# Patient Record
Sex: Male | Born: 1955 | Race: White | Hispanic: No | Marital: Married | Smoking: Never smoker
Health system: Southern US, Community
[De-identification: ages and names within clinical notes are randomized; demographics above are authoritative.]

## PROBLEM LIST (undated history)

## (undated) DIAGNOSIS — I10 Essential (primary) hypertension: Secondary | ICD-10-CM

## (undated) DIAGNOSIS — E785 Hyperlipidemia, unspecified: Secondary | ICD-10-CM

## (undated) HISTORY — DX: Hyperlipidemia, unspecified: E78.5

## (undated) HISTORY — DX: Essential (primary) hypertension: I10

## (undated) HISTORY — PX: CARDIAC CATHETERIZATION: SHX172

---

## 2002-04-25 ENCOUNTER — Emergency Department (HOSPITAL_COMMUNITY): Admission: EM | Admit: 2002-04-25 | Discharge: 2002-04-26 | Payer: Self-pay | Admitting: Emergency Medicine

## 2002-11-21 ENCOUNTER — Inpatient Hospital Stay (HOSPITAL_COMMUNITY): Admission: EM | Admit: 2002-11-21 | Discharge: 2002-11-23 | Payer: Self-pay | Admitting: Emergency Medicine

## 2010-05-23 ENCOUNTER — Other Ambulatory Visit: Payer: Self-pay | Admitting: Cardiovascular Disease

## 2010-05-23 MED ORDER — VALSARTAN-HYDROCHLOROTHIAZIDE 80-12.5 MG PO TABS: 1.0000 | ORAL_TABLET | Freq: Every day | ORAL | Status: DC

## 2010-05-23 NOTE — Telephone Encounter (Signed)
Want his Diavan HCT called in for 30 d supply at [facility] [address]  Please call patient for further detail

## 2010-05-23 NOTE — Telephone Encounter (Signed)
Pt not been seen in over a year, needs ov and msg left.Alfonso Ramus RN

## 2010-06-30 ENCOUNTER — Telehealth: Payer: Self-pay | Admitting: Cardiovascular Disease

## 2010-06-30 DIAGNOSIS — E785 Hyperlipidemia, unspecified: Secondary | ICD-10-CM

## 2010-06-30 NOTE — Telephone Encounter (Signed)
PT MADE A 53YR OV WITH DR NAHSER AND WANTS TO KNOW IF HE NEEDS TO HAVE ANY LABS. CHART IN BOX.

## 2010-06-30 NOTE — Telephone Encounter (Signed)
Needs labs, told to fast, orders put into system.

## 2010-07-03 ENCOUNTER — Telehealth: Payer: Self-pay | Admitting: Cardiovascular Disease

## 2010-07-03 NOTE — Telephone Encounter (Signed)
Chest discomfort with a lot of belching, Mylanta helped. He Spoke w on call Helen doc over weekend, sounded gastral in nature per pt/dr. Pain was bilateral upper chest, w/belching, had some relief with prilosec and mylanta, +sweating a bit, no sob, no nausea, didn't get vitals, pt did exercise 40 min on elliptical over weekend without discomfort/ pt at work. C/o still not feeling real well, gave Friday app and told to go to er if after hours but to call back if wants to be seen sooner, pt verbalized understanding and will call. Alfonso Ramus RN

## 2010-07-03 NOTE — Telephone Encounter (Signed)
Please call patient, he had some chest discomfort.  Did speak with the physician on call over the weekend.  Made appt for 07/07/2010.  Please call patient to discuss the need.

## 2010-07-04 ENCOUNTER — Encounter: Payer: Self-pay | Admitting: *Deleted

## 2010-07-04 ENCOUNTER — Ambulatory Visit (INDEPENDENT_AMBULATORY_CARE_PROVIDER_SITE_OTHER): Payer: 59 | Admitting: Cardiovascular Disease

## 2010-07-04 ENCOUNTER — Encounter: Payer: Self-pay | Admitting: Cardiovascular Disease

## 2010-07-04 DIAGNOSIS — I10 Essential (primary) hypertension: Secondary | ICD-10-CM | POA: Insufficient documentation

## 2010-07-04 DIAGNOSIS — E785 Hyperlipidemia, unspecified: Secondary | ICD-10-CM

## 2010-07-04 DIAGNOSIS — R079 Chest pain, unspecified: Secondary | ICD-10-CM | POA: Insufficient documentation

## 2010-07-04 LAB — BASIC METABOLIC PANEL
BUN: 14 mg/dL (ref 6–23)
CO2: 32 mEq/L (ref 19–32)
Calcium: 9.5 mg/dL (ref 8.4–10.5)
Chloride: 103 mEq/L (ref 96–112)
Creatinine, Ser: 0.9 mg/dL (ref 0.4–1.5)
GFR: 88.76 mL/min (ref >60.00)
Glucose, Bld: 99 mg/dL (ref 70–99)
Potassium: 4.3 mEq/L (ref 3.5–5.1)
Sodium: 140 mEq/L (ref 135–145)

## 2010-07-04 LAB — HEPATIC FUNCTION PANEL
ALT: 21 U/L (ref 0–53)
AST: 24 U/L (ref 0–37)
Albumin: 4.5 g/dL (ref 3.5–5.2)
Alkaline Phosphatase: 66 U/L (ref 39–117)
Bilirubin, Direct: 0 mg/dL (ref 0.0–0.3)
Total Bilirubin: 0.6 mg/dL (ref 0.3–1.2)
Total Protein: 7.4 g/dL (ref 6.0–8.3)

## 2010-07-04 LAB — LIPID PANEL
Cholesterol: 187 mg/dL (ref 0–200)
HDL: 43.4 mg/dL (ref >39.00)
LDL Cholesterol: 126 mg/dL — ABNORMAL HIGH (ref 0–99)
Total CHOL/HDL Ratio: 4
Triglycerides: 89 mg/dL (ref 0.0–149.0)
VLDL: 17.8 mg/dL (ref 0.0–40.0)

## 2010-07-04 NOTE — Progress Notes (Signed)
Bradley Dean Date of Birth  27-Mar-1955 [facility] / [facility] [address]     [address] [place], [place]  [address] [phone]  Fax  [phone]  History of Present Illness:  8-10 days of chest pain.    Still cycling and doing elliptical regularly - without problems.  Had some chest wall pain after the elliptical. This week he had some increased chest pain and lots of belching.  Relief after belching  Chest pain was mid sternal, between shoulder blades, non radiating.  Better after Mylanta.  Has been working out regularly - but yesterday he had some very mild chest pain.  1/10.  Pain lingered 30 minutes after completing his workout.  Current Outpatient Prescriptions  Medication Sig Dispense Refill  . aspirin 81 MG tablet Take 81 mg by mouth daily.        . fexofenadine (ALLEGRA) 180 MG tablet Take 180 mg by mouth daily.        Marland Kitchen FIBER PO Take 2 capsules by mouth daily.       . niacin (NIASPAN) 500 MG CR tablet Take 1,000 mg by mouth at bedtime.        . valsartan-hydrochlorothiazide (DIOVAN-HCT) 80-12.5 MG per tablet Take 0.5 tablets by mouth daily.        Marland Kitchen DISCONTD: valsartan-hydrochlorothiazide (DIOVAN HCT) 80-12.5 MG per tablet Take 1 tablet by mouth daily.  30 tablet  1  . DISCONTD: loratadine (CLARITIN) 10 MG tablet Take 10 mg by mouth daily.            Allergies  Allergen Reactions  . Sulfa Drugs Cross Reactors     Past Medical History  Diagnosis Date  . Hypertension   . Hyperlipidemia   . Chest pain     Past Surgical History  Procedure Date  . Cardiac catheterization     EF 65-&70% -- Normal left ventricular systolic function -- Smooth and normal coronary arteries. -- Vesta Mixer, M.D.    History  Smoking status  . Never Smoker   Smokeless tobacco  . Never Used    History  Alcohol Use No    Family History  Problem Relation Age of Onset  . Heart disease Father     Reviw of Systems:  Reviewed in the HPI.  All  other systems are negative.  Physical Exam: BP 144/94  Pulse 60  Ht 6\' 2"  (1.88 m)  Wt 233 lb (105.688 kg)  BMI 29.92 kg/m2 The patient is alert and oriented x 3.  The mood and affect are normal.   Skin: warm and dry.  Color is normal.    HEENT:   the sclera are nonicteric.  The mucous membranes are moist.  The carotids are 2+ without bruits.  There is no thyromegaly.  There is no JVD.    Lungs: clear.  The chest wall is non tender.    Heart: regular rate with a normal S1 and S2.  There are no murmurs, gallops, or rubs. The PMI is not displaced.     Abdomin: good bowel sounds.  There is no guarding or rebound.  There is no hepatosplenomegaly or tenderness.  There are no masses.   Extremities:  no clubbing, cyanosis, or edema.  The legs are without rashes.  The distal pulses are intact.   Neuro:  Cranial nerves II - XII are intact.  Motor and sensory functions are intact.    The gait is normal.  ECG: NSR. No ST  or T wave changes.  Assessment / Plan:

## 2010-07-04 NOTE — Assessment & Plan Note (Signed)
His blood pressure is well controlled. Continue his current medications.

## 2010-07-04 NOTE — Assessment & Plan Note (Signed)
Bradley Dean presents with some episodes of chest pain. It is difficult to discern whether this is angina. I like to schedule him for a stress echocardiogram for further evaluation. He had a stress echocardiogram several years ago which was normal.

## 2010-07-06 ENCOUNTER — Encounter: Payer: Self-pay | Admitting: Cardiovascular Disease

## 2010-07-06 NOTE — Progress Notes (Signed)
Pt informed of lab results. 

## 2010-07-07 ENCOUNTER — Ambulatory Visit: Payer: Self-pay | Admitting: Cardiovascular Disease

## 2010-07-13 ENCOUNTER — Other Ambulatory Visit (HOSPITAL_COMMUNITY): Payer: 59 | Admitting: Radiology

## 2010-07-14 ENCOUNTER — Telehealth: Payer: Self-pay | Admitting: Cardiovascular Disease

## 2010-07-14 NOTE — Telephone Encounter (Signed)
Pt had episode last night of "fleeting cp" with abd pain, bloat, sweaty, loose stool with relief after BM, feel ok today, no cp. Has stress echo on Monday, advised if he needs to cancel test he needs to go to er. Pt stated he has been following a bland diet to control gastric discomfort and it has helped and has lost 4-5 lb. Pt wants to complete stress echo to r/o heart problem and was just asking since weekend is here. Pt verbalized understanding to go to er if more gastric/cp symptoms over weekend.Alfonso Ramus RN

## 2010-07-14 NOTE — Telephone Encounter (Signed)
Pt has some questions about stress test he's scheduled for. Please call after 12 if possible

## 2010-07-17 ENCOUNTER — Ambulatory Visit (HOSPITAL_BASED_OUTPATIENT_CLINIC_OR_DEPARTMENT_OTHER): Payer: 59 | Admitting: Radiology

## 2010-07-17 ENCOUNTER — Ambulatory Visit (HOSPITAL_COMMUNITY): Payer: 59 | Attending: Cardiovascular Disease | Admitting: Radiology

## 2010-07-17 DIAGNOSIS — R072 Precordial pain: Secondary | ICD-10-CM

## 2010-07-17 DIAGNOSIS — E119 Type 2 diabetes mellitus without complications: Secondary | ICD-10-CM | POA: Insufficient documentation

## 2010-07-17 DIAGNOSIS — R0989 Other specified symptoms and signs involving the circulatory and respiratory systems: Secondary | ICD-10-CM

## 2010-07-17 DIAGNOSIS — E785 Hyperlipidemia, unspecified: Secondary | ICD-10-CM | POA: Insufficient documentation

## 2010-07-19 ENCOUNTER — Telehealth: Payer: Self-pay | Admitting: *Deleted

## 2010-07-19 NOTE — Telephone Encounter (Signed)
Patient called with echo results. Pt verbalized understanding. Wants to f/u with pcp, Alfonso Ramus RN

## 2010-07-19 NOTE — Telephone Encounter (Signed)
Message copied by Antony Odea on Wed Jul 19, 2010 11:04 AM ------      Message from: Vesta Mixer      Created: Tue Jul 18, 2010  5:39 AM       Normal stress echo.

## 2010-08-18 ENCOUNTER — Ambulatory Visit: Payer: Self-pay | Admitting: Cardiovascular Disease

## 2010-08-22 ENCOUNTER — Ambulatory Visit: Payer: Self-pay | Admitting: Cardiovascular Disease

## 2011-03-16 ENCOUNTER — Other Ambulatory Visit: Payer: Self-pay

## 2011-03-16 MED ORDER — NIACIN ER (ANTIHYPERLIPIDEMIC) 500 MG PO TBCR: 500.0000 mg | EXTENDED_RELEASE_TABLET | Freq: Every day | ORAL | Status: DC

## 2011-03-16 MED ORDER — VALSARTAN-HYDROCHLOROTHIAZIDE 80-12.5 MG PO TABS: 0.5000 | ORAL_TABLET | Freq: Every day | ORAL | Status: DC

## 2011-04-30 ENCOUNTER — Other Ambulatory Visit: Payer: Self-pay | Admitting: *Deleted

## 2011-04-30 ENCOUNTER — Telehealth: Payer: Self-pay | Admitting: Cardiovascular Disease

## 2011-04-30 MED ORDER — VALSARTAN-HYDROCHLOROTHIAZIDE 80-12.5 MG PO TABS: 0.5000 | ORAL_TABLET | Freq: Every day | ORAL | Status: DC

## 2011-04-30 MED ORDER — NIACIN ER (ANTIHYPERLIPIDEMIC) 500 MG PO TBCR: 500.0000 mg | EXTENDED_RELEASE_TABLET | Freq: Every day | ORAL | Status: DC

## 2011-04-30 NOTE — Telephone Encounter (Signed)
Fax Received. Refill Completed. Bradley Dean (R.M.A)   

## 2011-04-30 NOTE — Telephone Encounter (Signed)
Walk In pt form " pt Dropped off [facility] For Refill" sent to [facility] 04/30/11/KM

## 2011-05-22 ENCOUNTER — Telehealth: Payer: Self-pay | Admitting: Cardiovascular Disease

## 2011-05-22 MED ORDER — NIACIN ER (ANTIHYPERLIPIDEMIC) 1000 MG PO TBCR: 1000.0000 mg | EXTENDED_RELEASE_TABLET | Freq: Every day | ORAL | Status: DC

## 2011-05-22 NOTE — Telephone Encounter (Signed)
Pt needs appointment then refill can be made at the end of his 90 days of refill. Fax Received. Refill Completed. [name] (R.M.A)

## 2011-05-22 NOTE — Telephone Encounter (Signed)
niaspan er 500mg  was called in 1 per day and he takes 2 per day, [facility] 90 days supply, pls re call

## 2011-08-20 ENCOUNTER — Other Ambulatory Visit: Payer: Self-pay | Admitting: Cardiovascular Disease

## 2011-08-21 NOTE — Telephone Encounter (Signed)
Pt needs appointment then refill can be made Fax Received. Refill Completed. Bradley Dean (R.M.A)   

## 2011-11-26 ENCOUNTER — Other Ambulatory Visit: Payer: Self-pay | Admitting: *Deleted

## 2011-11-26 NOTE — Telephone Encounter (Signed)
Called patient and lmtcb to make appointment /labs. Patient has been reminded prior to make an appointment of previous med refills. Patient was advised to also see PCP.

## 2011-12-14 ENCOUNTER — Ambulatory Visit: Payer: 59 | Admitting: Cardiovascular Disease

## 2011-12-18 ENCOUNTER — Encounter: Payer: Self-pay | Admitting: Nurse Practitioner

## 2011-12-18 ENCOUNTER — Ambulatory Visit (INDEPENDENT_AMBULATORY_CARE_PROVIDER_SITE_OTHER): Payer: 59 | Admitting: Nurse Practitioner

## 2011-12-18 VITALS — BP 118/72 | HR 56 | Ht 74.0 in | Wt 220.4 lb

## 2011-12-18 DIAGNOSIS — E785 Hyperlipidemia, unspecified: Secondary | ICD-10-CM

## 2011-12-18 DIAGNOSIS — I10 Essential (primary) hypertension: Secondary | ICD-10-CM

## 2011-12-18 LAB — BASIC METABOLIC PANEL
BUN: 24 mg/dL — ABNORMAL HIGH (ref 6–23)
CO2: 27 mEq/L (ref 19–32)
Calcium: 9.5 mg/dL (ref 8.4–10.5)
Chloride: 100 mEq/L (ref 96–112)
Creatinine, Ser: 0.9 mg/dL (ref 0.4–1.5)
GFR: 88.28 mL/min (ref >60.00)
Glucose, Bld: 112 mg/dL — ABNORMAL HIGH (ref 70–99)
Potassium: 3.6 mEq/L (ref 3.5–5.1)
Sodium: 135 mEq/L (ref 135–145)

## 2011-12-18 LAB — HEPATIC FUNCTION PANEL
ALT: 31 U/L (ref 0–53)
AST: 30 U/L (ref 0–37)
Albumin: 4.2 g/dL (ref 3.5–5.2)
Alkaline Phosphatase: 70 U/L (ref 39–117)
Bilirubin, Direct: 0.1 mg/dL (ref 0.0–0.3)
Total Bilirubin: 0.9 mg/dL (ref 0.3–1.2)
Total Protein: 7.1 g/dL (ref 6.0–8.3)

## 2011-12-18 LAB — LIPID PANEL
Cholesterol: 173 mg/dL (ref 0–200)
HDL: 39.3 mg/dL (ref >39.00)
LDL Cholesterol: 119 mg/dL — ABNORMAL HIGH (ref 0–99)
Total CHOL/HDL Ratio: 4
Triglycerides: 72 mg/dL (ref 0.0–149.0)
VLDL: 14.4 mg/dL (ref 0.0–40.0)

## 2011-12-18 MED ORDER — NIACIN ER (ANTIHYPERLIPIDEMIC) 1000 MG PO TBCR: 1000.0000 mg | EXTENDED_RELEASE_TABLET | Freq: Every day | ORAL | Status: DC

## 2011-12-18 MED ORDER — VALSARTAN-HYDROCHLOROTHIAZIDE 80-12.5 MG PO TABS: 0.5000 | ORAL_TABLET | Freq: Every day | ORAL | Status: DC

## 2011-12-18 NOTE — Patient Instructions (Addendum)
Stay on your current medicines. I have refilled these today.  We will check labs today.  Keep up the good work with your diet/weight loss/exercise  Call the [facility] office at [phone] if you have any questions, problems or concerns.

## 2011-12-18 NOTE — Progress Notes (Signed)
Bradley Dean Date of Birth: Mar 31, 1955 Medical Record #[id number]  History of Present Illness: Bradley Dean is seen today for a follow up visit to refill his medicines. He is seen for Dr. Elease Hashimoto. Last seen in July of 2012. He has a positive family history of CAD. He has had remote cath back in 2004 showing no evidence of CAD. He does have HTN and HLD. Had a negative sress echo back in 2012.   He comes in today. He is here alone. He is doing ok. Has changed his diet and no longer eats flour/sugar. Has lost weight. Exercising 3 x a week. No chest pain. Not short of breath. Overall feels pretty good.   Current Outpatient Prescriptions on File Prior to Visit  Medication Sig Dispense Refill  . aspirin 81 MG tablet Take 81 mg by mouth daily.        . fexofenadine (ALLEGRA) 180 MG tablet Take 180 mg by mouth daily.        Marland Kitchen FIBER PO Take 2 capsules by mouth daily.       Marland Kitchen NASONEX 50 MCG/ACT nasal spray Place 2 sprays into the nose as needed.       . valsartan-hydrochlorothiazide (DIOVAN-HCT) 80-12.5 MG per tablet Take 0.5 tablets by mouth daily.  90 tablet  3    Allergies  Allergen Reactions  . Sulfa Drugs Cross Reactors     Past Medical History  Diagnosis Date  . Hypertension   . Hyperlipidemia   . Chest pain     negative stress echo in 2012    Past Surgical History  Procedure Date  . Cardiac catheterization     EF 65-&70% -- Normal left ventricular systolic function -- Smooth and normal coronary arteries. -- Vesta Mixer, M.D.    History  Smoking status  . Never Smoker   Smokeless tobacco  . Never Used    History  Alcohol Use No    Family History  Problem Relation Age of Onset  . Heart disease Father     Review of Systems: The review of systems is per the HPI.  All other systems were reviewed and are negative.  Physical Exam: BP 118/72  Pulse 56  Ht 6\' 2"  (1.88 m)  Wt 220 lb 6.4 oz (99.973 kg)  BMI 28.30 kg/m2 His weight is down 13 pounds from his last visit.   Patient is very pleasant and in no acute distress. Skin is warm and dry. Color is normal.  HEENT is unremarkable. Normocephalic/atraumatic. PERRL. Sclera are nonicteric. Neck is supple. No masses. No JVD. Lungs are clear. Cardiac exam shows a regular rate and rhythm. Abdomen is soft. Extremities are without edema. Gait and ROM are intact. No gross neurologic deficits noted.  LABORATORY DATA:  Lab Results  Component Value Date   GLUCOSE 99 07/04/2010   CHOL 187 07/04/2010   TRIG 89.0 07/04/2010   HDL 43.40 07/04/2010   LDLCALC 126* 07/04/2010   ALT 21 07/04/2010   AST 24 07/04/2010   NA 140 07/04/2010   K 4.3 07/04/2010   CL 103 07/04/2010   CREATININE 0.9 07/04/2010   BUN 14 07/04/2010   CO2 32 07/04/2010    Assessment / Plan: 1. HTN - blood pressure looks good. I have refilled his medicines today.   2. HLD - recheck his labs today  3. Positive family history of CAD - negative cath back in 2004 - negative stress echo in 2012. He is doing well clinically.  4. Obesity - actively losing weight - I encouraged him to continue with his program.   We will see him back in a year. Overall, he is doing well.   Patient is agreeable to this plan and will call if any problems develop in the interim.

## 2011-12-19 ENCOUNTER — Encounter: Payer: Self-pay | Admitting: Nurse Practitioner

## 2012-02-16 ENCOUNTER — Other Ambulatory Visit: Payer: Self-pay

## 2012-06-18 ENCOUNTER — Other Ambulatory Visit: Payer: Self-pay | Admitting: Nurse Practitioner

## 2012-06-18 DIAGNOSIS — G479 Sleep disorder, unspecified: Secondary | ICD-10-CM

## 2012-06-18 DIAGNOSIS — R5383 Other fatigue: Secondary | ICD-10-CM

## 2012-06-18 DIAGNOSIS — R454 Irritability and anger: Secondary | ICD-10-CM

## 2012-06-20 ENCOUNTER — Encounter: Payer: Self-pay | Admitting: Cardiovascular Disease

## 2012-06-30 ENCOUNTER — Encounter: Payer: Self-pay | Admitting: Nurse Practitioner

## 2012-06-30 NOTE — Telephone Encounter (Signed)
Patient called spoke to wife patient's sleep study scheduled at 8:30 pm 07/02/12.Wife stated husband will call his insurance to see what they will pay on  sleep study.

## 2012-07-02 ENCOUNTER — Ambulatory Visit (HOSPITAL_BASED_OUTPATIENT_CLINIC_OR_DEPARTMENT_OTHER): Payer: 59 | Attending: Nurse Practitioner | Admitting: Radiology

## 2012-07-02 VITALS — Ht 74.0 in | Wt 220.0 lb

## 2012-07-02 DIAGNOSIS — G479 Sleep disorder, unspecified: Secondary | ICD-10-CM

## 2012-07-02 DIAGNOSIS — R454 Irritability and anger: Secondary | ICD-10-CM

## 2012-07-02 DIAGNOSIS — R5383 Other fatigue: Secondary | ICD-10-CM

## 2012-07-02 DIAGNOSIS — G4733 Obstructive sleep apnea (adult) (pediatric): Secondary | ICD-10-CM | POA: Insufficient documentation

## 2012-07-10 DIAGNOSIS — G4733 Obstructive sleep apnea (adult) (pediatric): Secondary | ICD-10-CM

## 2012-07-10 NOTE — Procedures (Signed)
Bradley Dean, Bradley Dean                  ACCOUNT NO.:  [id number]  MEDICAL RECORD NO.:  [id number]          PATIENT TYPE:  OUT  LOCATION:  SLEEP CENTER                 FACILITY:  [facility]  PHYSICIAN:  Barbaraann Share, MD,FCCPDATE OF BIRTH:  1955-09-20  DATE OF STUDY:  07/02/2012                           NOCTURNAL POLYSOMNOGRAM  REFERRING PHYSICIAN:  Dante Gang, N.P.  INDICATION FOR STUDY:  Hypersomnia with sleep apnea.  EPWORTH SLEEPINESS SCORE:  Eight.  MEDICATIONS:  SLEEP ARCHITECTURE:  The patient had a total sleep time of 242 minutes with no slow-wave sleep and only 29 minutes of REM.  Sleep onset latency was normal at 16 minutes and REM onset was prolonged at 212 minutes. Sleep efficiency was poor at 50%.  RESPIRATORY DATA:  The patient was found to have 3 apneas and 34 obstructive hypopneas, giving him an apnea-hypopnea index of 9 events per hour.  The patient slept supine for almost the entire night, and the events were clearly worse during REM.  Mild snoring was noted throughout.  The patient did not meet split night protocol secondary to his small numbers of events, and the majority of these occurring after 1 a.m.  OXYGEN DATA:  There was O2 desaturation as low as 84% with the patient's obstructive events.  CARDIAC DATA:  No clinically significant arrhythmias were noted.  MOVEMENTS-PARASOMNIA:  The patient had no significant leg jerks or other abnormal behaviors noted during the night.  IMPRESSIONS-RECOMMENDATIONS:  Very mild obstructive sleep apnea/hypopnea syndrome, with an AHI of 9 events per hour and oxygen desaturation as low as 84%.  Treatment for this degree of sleep apnea can include a trial of weight loss alone, upper airway surgery, dental appliance, and also CPAP.  The decision to treat this mild degree of sleep apnea should be based upon its impact to the patient's quality of life, since it normally represents very little increased cardiovascular  risk.  Clinical correlation is suggested.     Barbaraann Share, MD,FCCP Diplomate, American Board of Sleep Medicine    [facility]/MEDQ  D:  07/10/2012 08:56:43  T:  07/10/2012 21:05:43  Job:  [id number]

## 2012-07-14 ENCOUNTER — Telehealth: Payer: Self-pay | Admitting: *Deleted

## 2012-07-14 NOTE — Telephone Encounter (Signed)
S/w pt about sleep study results pt will review test results on my chart and will get back with our office if necessary  pt aware sleep apnea was very mild and stated still wasn't sleeping well but maybe the apnea wasn't the problem

## 2012-07-17 ENCOUNTER — Encounter: Payer: Self-pay | Admitting: Nurse Practitioner

## 2012-07-18 ENCOUNTER — Encounter: Payer: Self-pay | Admitting: Nurse Practitioner

## 2012-07-18 ENCOUNTER — Telehealth: Payer: Self-pay | Admitting: *Deleted

## 2012-07-18 NOTE — Telephone Encounter (Signed)
Left message on machine to let pt know I mailed a copy of sleep study results today didn't think pt could see test results on my chart

## 2012-11-06 ENCOUNTER — Other Ambulatory Visit: Payer: Self-pay

## 2012-12-05 ENCOUNTER — Ambulatory Visit: Payer: 59 | Admitting: Nurse Practitioner

## 2012-12-08 ENCOUNTER — Other Ambulatory Visit: Payer: Self-pay | Admitting: *Deleted

## 2012-12-08 ENCOUNTER — Ambulatory Visit (INDEPENDENT_AMBULATORY_CARE_PROVIDER_SITE_OTHER): Payer: 59 | Admitting: Nurse Practitioner

## 2012-12-08 ENCOUNTER — Encounter: Payer: Self-pay | Admitting: Nurse Practitioner

## 2012-12-08 VITALS — BP 150/86 | HR 54 | Ht 74.0 in | Wt 223.1 lb

## 2012-12-08 DIAGNOSIS — E785 Hyperlipidemia, unspecified: Secondary | ICD-10-CM

## 2012-12-08 DIAGNOSIS — I10 Essential (primary) hypertension: Secondary | ICD-10-CM

## 2012-12-08 LAB — BASIC METABOLIC PANEL
BUN: 22 mg/dL (ref 6–23)
CO2: 29 mEq/L (ref 19–32)
Calcium: 9.4 mg/dL (ref 8.4–10.5)
Chloride: 101 mEq/L (ref 96–112)
Creatinine, Ser: 0.9 mg/dL (ref 0.4–1.5)
GFR: 92.5 mL/min (ref >60.00)
Glucose, Bld: 108 mg/dL — ABNORMAL HIGH (ref 70–99)
Potassium: 3.9 mEq/L (ref 3.5–5.1)
Sodium: 137 mEq/L (ref 135–145)

## 2012-12-08 LAB — HEPATIC FUNCTION PANEL
ALT: 26 U/L (ref 0–53)
AST: 25 U/L (ref 0–37)
Albumin: 4.2 g/dL (ref 3.5–5.2)
Alkaline Phosphatase: 68 U/L (ref 39–117)
Bilirubin, Direct: 0 mg/dL (ref 0.0–0.3)
Total Bilirubin: 0.5 mg/dL (ref 0.3–1.2)
Total Protein: 7.4 g/dL (ref 6.0–8.3)

## 2012-12-08 LAB — LIPID PANEL
Cholesterol: 189 mg/dL (ref 0–200)
HDL: 36.1 mg/dL — ABNORMAL LOW (ref >39.00)
LDL Cholesterol: 136 mg/dL — ABNORMAL HIGH (ref 0–99)
Total CHOL/HDL Ratio: 5
Triglycerides: 83 mg/dL (ref 0.0–149.0)
VLDL: 16.6 mg/dL (ref 0.0–40.0)

## 2012-12-08 MED ORDER — VALSARTAN-HYDROCHLOROTHIAZIDE 80-12.5 MG PO TABS: 0.5000 | ORAL_TABLET | Freq: Every day | ORAL | Status: DC

## 2012-12-08 MED ORDER — ATORVASTATIN CALCIUM 10 MG PO TABS: 10.0000 mg | ORAL_TABLET | Freq: Every day | ORAL | Status: DC

## 2012-12-08 NOTE — Patient Instructions (Addendum)
Continue with your current medicines but stop the Niaspan  I have refilled your Valsartan/HCT  Keep exercising  We will check labs today - then we will decide which statin to start  See Dr. Elease Hashimoto in one year  Call the [facility] office at [phone] if you have any questions, problems or concerns.

## 2012-12-08 NOTE — Progress Notes (Signed)
Bradley Dean Date of Birth: Sep 05, 1955 Medical Record #[id number]  History of Present Illness: Bradley Dean is seen back today for a one year check. Seen for Dr. Elease Dean. Last seen a year ago in December of 2013. Has a positive family history of CAD, remote cath back in 2004 showing no evidence of CAD. Has HTN and HLD. Negative stress echo in 2012.   Seen last year and was doing well - had altered his diet and was losing weight and was exercising.  Comes back today. Here alone. Needs medicines refilled today. He feels good. No chest pain. Not short of breath. Exercising about 4 days a week for about 30 minutes. Trying to watch his diet but has picked up a few pounds. Remains on Niaspan - says this is the only drug he has ever tried - never tried on statin therapy but he is agreeable.   Current Outpatient Prescriptions  Medication Sig Dispense Refill  . aspirin 81 MG tablet Take 81 mg by mouth daily.        . fexofenadine (ALLEGRA) 180 MG tablet Take 180 mg by mouth daily.        Marland Kitchen FIBER PO Take 2 capsules by mouth daily.       Marland Kitchen NASONEX 50 MCG/ACT nasal spray Place 2 sprays into the nose as needed.       . niacin (NIASPAN) 1000 MG CR tablet Take 1 tablet (1,000 mg total) by mouth at bedtime.  90 tablet  3  . valsartan-hydrochlorothiazide (DIOVAN-HCT) 80-12.5 MG per tablet Take 0.5 tablets by mouth daily.  90 tablet  3   No current facility-administered medications for this visit.    Allergies  Allergen Reactions  . Sulfa Drugs Cross Reactors     Past Medical History  Diagnosis Date  . Hypertension   . Hyperlipidemia   . Chest pain     negative stress echo in 2012    Past Surgical History  Procedure Laterality Date  . Cardiac catheterization      EF 65-&70% -- Normal left ventricular systolic function -- Smooth and normal coronary arteries. -- Bradley Dean, M.D.    History  Smoking status  . Never Smoker   Smokeless tobacco  . Never Used    History  Alcohol Use No     Family History  Problem Relation Age of Onset  . Heart disease Father     Review of Systems: The review of systems is per the HPI.  All other systems were reviewed and are negative.  Physical Exam: BP 150/86  Pulse 54  Ht 6\' 2"  (1.88 m)  Wt 223 lb 1.9 oz (101.207 kg)  BMI 28.63 kg/m2  SpO2 97% Recheck of his BP is 130/85 Patient is very pleasant and in no acute distress. Skin is warm and dry. Color is normal.  HEENT is unremarkable. Normocephalic/atraumatic. PERRL. Sclera are nonicteric. Neck is supple. No masses. No JVD. Lungs are clear. Cardiac exam shows a regular rate and rhythm. Abdomen is soft. Extremities are without edema. Gait and ROM are intact. No gross neurologic deficits noted.  Wt Readings from Last 3 Encounters:  12/08/12 223 lb 1.9 oz (101.207 kg)  07/02/12 220 lb (99.791 kg)  12/18/11 220 lb 6.4 oz (99.973 kg)     LABORATORY DATA: PENDING  Lab Results  Component Value Date   GLUCOSE 112* 12/18/2011   CHOL 173 12/18/2011   TRIG 72.0 12/18/2011   HDL 39.30 12/18/2011   LDLCALC 119* 12/18/2011  ALT 31 12/18/2011   AST 30 12/18/2011   NA 135 12/18/2011   K 3.6 12/18/2011   CL 100 12/18/2011   CREATININE 0.9 12/18/2011   BUN 24* 12/18/2011   CO2 27 12/18/2011     Assessment / Plan:  1. HTN - recheck of his BP is ok by me - he is to continue to monitor at home. Medicine is refilled today.   2. HLD - needs labs checked today - I have stopped his Niaspan based on the current data - will check lipids and hope to start statin therapy.   3. Positive FH for CAD   Tentatively see him back in a year. Encouraged regular exercise of 45 minutes per day.   Patient is agreeable to this plan and will call if any problems develop in the interim.   Rosalio Macadamia, RN, ANP-C [facility] [address] [place], [place]  [address]

## 2013-01-21 ENCOUNTER — Other Ambulatory Visit: Payer: Self-pay | Admitting: *Deleted

## 2013-01-21 ENCOUNTER — Other Ambulatory Visit (INDEPENDENT_AMBULATORY_CARE_PROVIDER_SITE_OTHER): Payer: 59

## 2013-01-21 ENCOUNTER — Encounter: Payer: Self-pay | Admitting: Nurse Practitioner

## 2013-01-21 DIAGNOSIS — E785 Hyperlipidemia, unspecified: Secondary | ICD-10-CM

## 2013-01-21 LAB — HEPATIC FUNCTION PANEL
ALT: 35 U/L (ref 0–53)
AST: 31 U/L (ref 0–37)
Albumin: 4.2 g/dL (ref 3.5–5.2)
Alkaline Phosphatase: 76 U/L (ref 39–117)
Bilirubin, Direct: 0 mg/dL (ref 0.0–0.3)
Total Bilirubin: 0.5 mg/dL (ref 0.3–1.2)
Total Protein: 7.5 g/dL (ref 6.0–8.3)

## 2013-01-21 LAB — LIPID PANEL
Cholesterol: 139 mg/dL (ref 0–200)
HDL: 32.5 mg/dL — ABNORMAL LOW (ref >39.00)
LDL Cholesterol: 84 mg/dL (ref 0–99)
Total CHOL/HDL Ratio: 4
Triglycerides: 113 mg/dL (ref 0.0–149.0)
VLDL: 22.6 mg/dL (ref 0.0–40.0)

## 2013-01-21 LAB — HEMOGLOBIN A1C: Hgb A1c MFr Bld: 5.7 % (ref 4.6–6.5)

## 2013-01-26 ENCOUNTER — Other Ambulatory Visit: Payer: Self-pay | Admitting: Nurse Practitioner

## 2013-01-26 DIAGNOSIS — E785 Hyperlipidemia, unspecified: Secondary | ICD-10-CM

## 2013-01-26 MED ORDER — ATORVASTATIN CALCIUM 10 MG PO TABS: 10.0000 mg | ORAL_TABLET | Freq: Every day | ORAL | Status: DC

## 2013-03-30 ENCOUNTER — Encounter: Payer: Self-pay | Admitting: Nurse Practitioner

## 2013-03-30 ENCOUNTER — Other Ambulatory Visit: Payer: Self-pay

## 2013-03-30 DIAGNOSIS — E785 Hyperlipidemia, unspecified: Secondary | ICD-10-CM

## 2013-03-30 MED ORDER — VALSARTAN-HYDROCHLOROTHIAZIDE 80-12.5 MG PO TABS: 0.5000 | ORAL_TABLET | Freq: Every day | ORAL | Status: DC

## 2013-03-30 MED ORDER — ATORVASTATIN CALCIUM 10 MG PO TABS: 10.0000 mg | ORAL_TABLET | Freq: Every day | ORAL | Status: DC

## 2013-03-31 ENCOUNTER — Telehealth: Payer: Self-pay | Admitting: *Deleted

## 2013-03-31 DIAGNOSIS — E785 Hyperlipidemia, unspecified: Secondary | ICD-10-CM

## 2013-03-31 MED ORDER — VALSARTAN-HYDROCHLOROTHIAZIDE 80-12.5 MG PO TABS: 0.5000 | ORAL_TABLET | Freq: Every day | ORAL | Status: DC

## 2013-03-31 MED ORDER — ATORVASTATIN CALCIUM 10 MG PO TABS: 10.0000 mg | ORAL_TABLET | Freq: Every day | ORAL | Status: DC

## 2013-03-31 NOTE — Telephone Encounter (Signed)
Refill done as pt requested via mychart.

## 2013-04-06 ENCOUNTER — Other Ambulatory Visit: Payer: Self-pay | Admitting: *Deleted

## 2013-04-06 MED ORDER — VALSARTAN-HYDROCHLOROTHIAZIDE 80-12.5 MG PO TABS: 0.5000 | ORAL_TABLET | Freq: Every day | ORAL | Status: DC

## 2013-07-13 ENCOUNTER — Other Ambulatory Visit: Payer: Self-pay

## 2013-10-16 ENCOUNTER — Other Ambulatory Visit: Payer: Self-pay

## 2013-11-06 ENCOUNTER — Encounter: Payer: Self-pay | Admitting: Cardiovascular Disease

## 2014-02-15 ENCOUNTER — Other Ambulatory Visit: Payer: Self-pay | Admitting: Nurse Practitioner

## 2014-03-19 ENCOUNTER — Other Ambulatory Visit: Payer: Self-pay

## 2014-03-19 MED ORDER — ATORVASTATIN CALCIUM 10 MG PO TABS: ORAL_TABLET | ORAL | Status: DC

## 2014-03-29 ENCOUNTER — Telehealth: Payer: Self-pay | Admitting: Nurse Practitioner

## 2014-03-29 NOTE — Telephone Encounter (Signed)
Left message for patient to call the office; patient needs a follow-up appointment with Dr. Elease HashimotoNahser for medications to continue to be refilled

## 2014-05-13 ENCOUNTER — Encounter: Payer: Self-pay | Admitting: Nurse Practitioner

## 2014-05-13 ENCOUNTER — Ambulatory Visit (INDEPENDENT_AMBULATORY_CARE_PROVIDER_SITE_OTHER): Payer: 59 | Admitting: Nurse Practitioner

## 2014-05-13 VITALS — BP 160/90 | HR 55 | Ht 74.0 in | Wt 221.1 lb

## 2014-05-13 DIAGNOSIS — E785 Hyperlipidemia, unspecified: Secondary | ICD-10-CM | POA: Diagnosis not present

## 2014-05-13 DIAGNOSIS — R079 Chest pain, unspecified: Secondary | ICD-10-CM

## 2014-05-13 DIAGNOSIS — I10 Essential (primary) hypertension: Secondary | ICD-10-CM | POA: Diagnosis not present

## 2014-05-13 LAB — HEPATIC FUNCTION PANEL
ALT: 28 U/L (ref 0–53)
AST: 24 U/L (ref 0–37)
Albumin: 4.2 g/dL (ref 3.5–5.2)
Alkaline Phosphatase: 65 U/L (ref 39–117)
Bilirubin, Direct: 0.2 mg/dL (ref 0.0–0.3)
Total Bilirubin: 0.7 mg/dL (ref 0.2–1.2)
Total Protein: 7.7 g/dL (ref 6.0–8.3)

## 2014-05-13 LAB — LIPID PANEL
Cholesterol: 165 mg/dL (ref 0–200)
HDL: 43.5 mg/dL (ref >39.00)
LDL Cholesterol: 104 mg/dL — ABNORMAL HIGH (ref 0–99)
NonHDL: 121.5
Total CHOL/HDL Ratio: 4
Triglycerides: 90 mg/dL (ref 0.0–149.0)
VLDL: 18 mg/dL (ref 0.0–40.0)

## 2014-05-13 LAB — BASIC METABOLIC PANEL
BUN: 16 mg/dL (ref 6–23)
CO2: 32 mEq/L (ref 19–32)
Calcium: 10.2 mg/dL (ref 8.4–10.5)
Chloride: 98 mEq/L (ref 96–112)
Creatinine, Ser: 0.96 mg/dL (ref 0.40–1.50)
GFR: 85.43 mL/min (ref >60.00)
Glucose, Bld: 107 mg/dL — ABNORMAL HIGH (ref 70–99)
Potassium: 4.6 mEq/L (ref 3.5–5.1)
Sodium: 136 mEq/L (ref 135–145)

## 2014-05-13 MED ORDER — ATORVASTATIN CALCIUM 10 MG PO TABS: ORAL_TABLET | ORAL | Status: DC

## 2014-05-13 MED ORDER — VALSARTAN-HYDROCHLOROTHIAZIDE 80-12.5 MG PO TABS: 1.0000 | ORAL_TABLET | Freq: Every day | ORAL | Status: DC

## 2014-05-13 NOTE — Progress Notes (Signed)
CARDIOLOGY OFFICE NOTE  Date:  05/13/2014    Bradley Dean Date of Birth: 01/24/1955 Medical Record #[id number]  PCP:   Duane Lopeoss, Alan, MD  Cardiologist:  Nahser  Chief Complaint  Patient presents with  . Hypertension    FU visit - seen for Dr. Elease HashimotoNahser.    History of Present Illness: Bradley Dean is a 59 y.o. male who presents today for a follow up visit. Has not been seen here since 2014. Has a positive family history of CAD, remote cath back in 2004 showing no evidence of CAD. Has HTN and HLD. Negative stress echo in 2012.   Last seen in December of 2014. Needed medicines refilled. He was doing well.  Comes back today. Here alone. Doing well. Working from home now with [employer]. Feels good. Notes that BP is trending up at home and is up here today as well. No chest pain. Not short of breath. Tolerating his medicines. Some muscle/joint aching and he wonders if this is from his statin.   Past Medical History  Diagnosis Date  . Hypertension   . Hyperlipidemia   . Chest pain     negative stress echo in 2012    Past Surgical History  Procedure Laterality Date  . Cardiac catheterization      EF 65-&70% -- Normal left ventricular systolic function -- Smooth and normal coronary arteries. -- Vesta MixerPhilip J. Nahser, M.D.     Medications: Current Outpatient Prescriptions  Medication Sig Dispense Refill  . aspirin 81 MG tablet Take 81 mg by mouth daily.      Marland Kitchen. atorvastatin (LIPITOR) 10 MG tablet Take 1 tablet by mouth  daily 90 tablet 3  . fexofenadine (ALLEGRA) 180 MG tablet Take 180 mg by mouth daily.      Marland Kitchen. FIBER PO Take 2 capsules by mouth daily.     Marland Kitchen. NASONEX 50 MCG/ACT nasal spray Place 2 sprays into the nose as needed.     . valsartan-hydrochlorothiazide (DIOVAN-HCT) 80-12.5 MG per tablet Take 1 tablet by mouth daily. 90 tablet 3   No current facility-administered medications for this visit.    Allergies: Allergies  Allergen Reactions  . Sulfa Drugs Cross Reactors      Social History: The patient  reports that he has never smoked. He has never used smokeless tobacco. He reports that he does not drink alcohol or use illicit drugs.   Family History: The patient's family history includes Heart disease in his father.   Review of Systems: Please see the history of present illness. Some muscle/joint aching.  All other systems are reviewed and negative.   Physical Exam: VS:  BP 160/90 mmHg  Pulse 55  Ht 6\' 2"  (1.88 m)  Wt 221 lb 1.9 oz (100.299 kg)  BMI 28.38 kg/m2 .  BMI Body mass index is 28.38 kg/(m^2).  Wt Readings from Last 3 Encounters:  05/13/14 221 lb 1.9 oz (100.299 kg)  12/08/12 223 lb 1.9 oz (101.207 kg)  07/02/12 220 lb (99.791 kg)    General: Pleasant. Well developed, well nourished and in no acute distress.  HEENT: Normal. Neck: Supple, no JVD, carotid bruits, or masses noted.  Cardiac: Regular rate and rhythm. No murmurs, rubs, or gallops. No edema.  Respiratory:  Lungs are clear to auscultation bilaterally with normal work of breathing.  GI: Soft and nontender.  MS: No deformity or atrophy. Gait and ROM intact. Skin: Warm and dry. Color is normal.  Neuro:  Strength and sensation are  intact and no gross focal deficits noted.  Psych: Alert, appropriate and with normal affect.   LABORATORY DATA:  EKG:  EKG is ordered today. This demonstrates sinus brady.  Lab Results  Component Value Date   GLUCOSE 108* 12/08/2012   CHOL 139 01/21/2013   TRIG 113.0 01/21/2013   HDL 32.50* 01/21/2013   LDLCALC 84 01/21/2013   ALT 35 01/21/2013   AST 31 01/21/2013   NA 137 12/08/2012   K 3.9 12/08/2012   CL 101 12/08/2012   CREATININE 0.9 12/08/2012   BUN 22 12/08/2012   CO2 29 12/08/2012   HGBA1C 5.7 01/21/2013    BNP (last 3 results) No results for input(s): BNP in the last 8760 hours.  ProBNP (last 3 results) No results for input(s): PROBNP in the last 8760 hours.   Other Studies Reviewed Today:   Assessment/Plan: 1.  HTN - BP trended up - will increase his Diovan HCT to a full tablet each day.   2. HLD - needs labs checked today - some aching with his statin - will give him a drug holiday and he will call back with an update. Could change to $3 Crestor if needed.  3. Positive FH for CAD - continue with CV risk factor modification.   Current medicines are reviewed with the patient today.  The patient does not have concerns regarding medicines other than what has been noted above.  The following changes have been made:  See above.  Labs/ tests ordered today include:    Orders Placed This Encounter  Procedures  . Basic metabolic panel  . Hepatic function panel  . Lipid panel  . EKG 12-Lead     Disposition:   FU with Dr. Elease HashimotoNahser in one year.   Patient is agreeable to this plan and will call if any problems develop in the interim.   Signed: Rosalio MacadamiaLori C. Tayden Nichelson, RN, ANP-C 05/13/2014 10:31 AM  [facility] [address] [place], [place]  [address] Phone: [phone] Fax: [phone]

## 2014-05-13 NOTE — Patient Instructions (Addendum)
We will be checking the following labs today - BMET, Lipids and HPF  Lab in one month - BMET (not fasting)  Medication Instructions:    Continue with your current medicines but  I am increasing the Diovan HCT to a whole tablet each day  I have refilled your medicines today    Testing/Procedures To Be Arranged:  N/A  Follow-Up:   See Dr. Elease HashimotoNahser or me in one year.     Other Special Instructions:   Ok to stop the Lipitor for 2 to 3 weeks - if your joint/muscle aching goes away - call me and we will change to Crestor. If it does not go away - you may resume the Lipitor  Call the [facility] office at [phone] if you have any questions, problems or concerns.

## 2014-05-31 ENCOUNTER — Encounter: Payer: Self-pay | Admitting: Nurse Practitioner

## 2014-06-01 ENCOUNTER — Telehealth: Payer: Self-pay | Admitting: Nurse Practitioner

## 2014-06-01 ENCOUNTER — Other Ambulatory Visit: Payer: Self-pay | Admitting: *Deleted

## 2014-06-01 DIAGNOSIS — E785 Hyperlipidemia, unspecified: Secondary | ICD-10-CM

## 2014-06-01 MED ORDER — ROSUVASTATIN CALCIUM 5 MG PO TABS: 5.0000 mg | ORAL_TABLET | Freq: Every day | ORAL | Status: DC

## 2014-06-01 MED ORDER — ROSUVASTATIN CALCIUM 5 MG PO TABS
5.0000 mg | ORAL_TABLET | Freq: Every day | ORAL | Status: DC
Start: 2014-06-01 — End: 2014-06-01

## 2014-06-01 NOTE — Telephone Encounter (Signed)
New Message  Pt requested to speak w/ Danielle. Please call back and discuss.

## 2014-06-01 NOTE — Telephone Encounter (Signed)
S/w pt is agreeable to plan will come in on July 29 for repeat labs orders in and linked, appointment made.  Changed liipitor and listed as an intolorence.  Added crestor ( 5 mg ) daily  Sent into both pharamacies and diovan was already called in.

## 2014-06-11 ENCOUNTER — Other Ambulatory Visit (INDEPENDENT_AMBULATORY_CARE_PROVIDER_SITE_OTHER): Payer: 59 | Admitting: *Deleted

## 2014-06-11 ENCOUNTER — Encounter: Payer: Self-pay | Admitting: Nurse Practitioner

## 2014-06-11 DIAGNOSIS — E785 Hyperlipidemia, unspecified: Secondary | ICD-10-CM | POA: Diagnosis not present

## 2014-06-11 DIAGNOSIS — R079 Chest pain, unspecified: Secondary | ICD-10-CM

## 2014-06-11 DIAGNOSIS — I10 Essential (primary) hypertension: Secondary | ICD-10-CM

## 2014-06-11 LAB — BASIC METABOLIC PANEL
BUN: 16 mg/dL (ref 6–23)
CO2: 31 mEq/L (ref 19–32)
Calcium: 9.7 mg/dL (ref 8.4–10.5)
Chloride: 98 mEq/L (ref 96–112)
Creatinine, Ser: 1.02 mg/dL (ref 0.40–1.50)
GFR: 79.63 mL/min (ref >60.00)
Glucose, Bld: 101 mg/dL — ABNORMAL HIGH (ref 70–99)
Potassium: 4 mEq/L (ref 3.5–5.1)
Sodium: 135 mEq/L (ref 135–145)

## 2014-06-14 ENCOUNTER — Telehealth: Payer: Self-pay

## 2014-06-14 ENCOUNTER — Other Ambulatory Visit: Payer: Self-pay | Admitting: *Deleted

## 2014-06-14 ENCOUNTER — Telehealth: Payer: Self-pay | Admitting: Nurse Practitioner

## 2014-06-14 MED ORDER — VALSARTAN-HYDROCHLOROTHIAZIDE 80-12.5 MG PO TABS: 1.0000 | ORAL_TABLET | Freq: Every day | ORAL | Status: DC

## 2014-06-14 NOTE — Telephone Encounter (Signed)
error 

## 2014-06-14 NOTE — Telephone Encounter (Signed)
Patient received a call from [facility] telling him his new rx from [name] NP needed a Covering MD to approve it. I called [facility] and told them Leodis Sias MD is supervising MD. They will ship his Valsartan 80-12.5mg .

## 2014-07-30 ENCOUNTER — Other Ambulatory Visit: Payer: 59

## 2014-08-02 ENCOUNTER — Other Ambulatory Visit (INDEPENDENT_AMBULATORY_CARE_PROVIDER_SITE_OTHER): Payer: 59 | Admitting: *Deleted

## 2014-08-02 DIAGNOSIS — E785 Hyperlipidemia, unspecified: Secondary | ICD-10-CM | POA: Diagnosis not present

## 2014-08-02 LAB — HEPATIC FUNCTION PANEL
ALT: 21 U/L (ref 0–53)
AST: 21 U/L (ref 0–37)
Albumin: 4.1 g/dL (ref 3.5–5.2)
Alkaline Phosphatase: 65 U/L (ref 39–117)
Bilirubin, Direct: 0.1 mg/dL (ref 0.0–0.3)
Total Bilirubin: 0.4 mg/dL (ref 0.2–1.2)
Total Protein: 7 g/dL (ref 6.0–8.3)

## 2014-08-02 LAB — LIPID PANEL
Cholesterol: 140 mg/dL (ref 0–200)
HDL: 36.2 mg/dL — ABNORMAL LOW (ref >39.00)
LDL Cholesterol: 80 mg/dL (ref 0–99)
NonHDL: 103.35
Total CHOL/HDL Ratio: 4
Triglycerides: 118 mg/dL (ref 0.0–149.0)
VLDL: 23.6 mg/dL (ref 0.0–40.0)

## 2014-08-06 NOTE — Progress Notes (Signed)
Noted  

## 2015-03-11 ENCOUNTER — Other Ambulatory Visit: Payer: Self-pay | Admitting: *Deleted

## 2015-03-11 DIAGNOSIS — E785 Hyperlipidemia, unspecified: Secondary | ICD-10-CM

## 2015-03-11 MED ORDER — ROSUVASTATIN CALCIUM 5 MG PO TABS: 5.0000 mg | ORAL_TABLET | Freq: Every day | ORAL | Status: DC

## 2015-05-03 ENCOUNTER — Other Ambulatory Visit: Payer: Self-pay | Admitting: Nurse Practitioner

## 2015-05-04 ENCOUNTER — Other Ambulatory Visit: Payer: Self-pay | Admitting: Cardiovascular Disease

## 2015-05-13 ENCOUNTER — Ambulatory Visit: Payer: 59 | Admitting: Nurse Practitioner

## 2015-06-10 ENCOUNTER — Ambulatory Visit: Payer: 59 | Admitting: Nurse Practitioner

## 2015-06-28 ENCOUNTER — Ambulatory Visit (INDEPENDENT_AMBULATORY_CARE_PROVIDER_SITE_OTHER): Payer: 59 | Admitting: Nurse Practitioner

## 2015-06-28 ENCOUNTER — Encounter: Payer: Self-pay | Admitting: Nurse Practitioner

## 2015-06-28 VITALS — BP 132/84 | HR 53 | Ht 74.0 in | Wt 229.0 lb

## 2015-06-28 DIAGNOSIS — E785 Hyperlipidemia, unspecified: Secondary | ICD-10-CM | POA: Diagnosis not present

## 2015-06-28 DIAGNOSIS — I10 Essential (primary) hypertension: Secondary | ICD-10-CM

## 2015-06-28 LAB — LIPID PANEL
Cholesterol: 159 mg/dL (ref 125–200)
HDL: 43 mg/dL (ref >=40)
LDL Cholesterol: 88 mg/dL (ref <130)
Total CHOL/HDL Ratio: 3.7 Ratio (ref <=5.0)
Triglycerides: 140 mg/dL (ref <150)
VLDL: 28 mg/dL (ref <30)

## 2015-06-28 LAB — BASIC METABOLIC PANEL
BUN: 19 mg/dL (ref 7–25)
CO2: 29 mmol/L (ref 20–31)
Calcium: 9.8 mg/dL (ref 8.6–10.3)
Chloride: 99 mmol/L (ref 98–110)
Creat: 0.99 mg/dL (ref 0.70–1.33)
Glucose, Bld: 110 mg/dL — ABNORMAL HIGH (ref 65–99)
Potassium: 4.8 mmol/L (ref 3.5–5.3)
Sodium: 137 mmol/L (ref 135–146)

## 2015-06-28 LAB — HEPATIC FUNCTION PANEL
ALT: 24 U/L (ref 9–46)
AST: 28 U/L (ref 10–35)
Albumin: 4.4 g/dL (ref 3.6–5.1)
Alkaline Phosphatase: 60 U/L (ref 40–115)
Bilirubin, Direct: 0.1 mg/dL (ref <=0.2)
Indirect Bilirubin: 0.5 mg/dL (ref 0.2–1.2)
Total Bilirubin: 0.6 mg/dL (ref 0.2–1.2)
Total Protein: 7.1 g/dL (ref 6.1–8.1)

## 2015-06-28 NOTE — Progress Notes (Signed)
CARDIOLOGY OFFICE NOTE  Date:  06/28/2015    Bradley Dean Date of Birth: 01/26/1955 Medical Record #[id number]  PCP:   Duane Lopeoss, Alan, MD  Cardiologist:  [facility]yrone SageGerhardt [facility] Nahser    Chief Complaint  Patient presents with  . Hyperlipidemia  . Hypertension    One year check - seen for Dr. Elease HashimotoNahser    History of Present Illness: Bradley Dean is a 60 y.o. male who presents today for a one year check. Seen for Dr. Elease HashimotoNahser.   Has a positive family history of CAD, remote cath back in 2004 showing no evidence of CAD. Has HTN and HLD. Negative stress echo in 2012.   Last seen in May of 2016 - he was doing well.   Comes back today. Here alone. He has had a good year. No chest pain. Not short of breath. No passing out spells. Very transient lightheadedness if he has sat for a few hours and then goes to stand up - but no syncope. BP is good at home. Feels ok on his medicines. Exercising regularly. He is happy with how he is doing. He is fasting today.    Past Medical History  Diagnosis Date  . Hypertension   . Hyperlipidemia   . Chest pain     negative stress echo in 2012    Past Surgical History  Procedure Laterality Date  . Cardiac catheterization      EF 65-&70% -- Normal left ventricular systolic function -- Smooth and normal coronary arteries. -- Vesta MixerPhilip J. Nahser, M.D.     Medications: Current Outpatient Prescriptions  Medication Sig Dispense Refill  . aspirin 81 MG tablet Take 81 mg by mouth daily.      . fexofenadine (ALLEGRA) 180 MG tablet Take 180 mg by mouth daily.      Marland Kitchen. FIBER PO Take 2 capsules by mouth daily.     Marland Kitchen. NASONEX 50 MCG/ACT nasal spray Place 2 sprays into the nose as needed.     . rosuvastatin (CRESTOR) 5 MG tablet Take 1 tablet (5 mg total) by mouth daily at 6 PM. 90 tablet 3  . valsartan-hydrochlorothiazide (DIOVAN-HCT) 80-12.5 MG tablet Take 1 tablet by mouth  daily 90 tablet 1   No current facility-administered medications for this visit.     Allergies: Allergies  Allergen Reactions  . Sulfa Drugs Cross Reactors   . Lipitor [Atorvastatin]     intolerance    Social History: The patient  reports that he has never smoked. He has never used smokeless tobacco. He reports that he does not drink alcohol or use illicit drugs.   Family History: The patient's family history includes Heart disease in his father.   Review of Systems: Please see the history of present illness.   Otherwise, the review of systems is positive for none.   All other systems are reviewed and negative.   Physical Exam: VS:  BP 132/84 mmHg  Pulse 53  Ht 6\' 2"  (1.88 m)  Wt 229 lb (103.874 kg)  BMI 29.39 kg/m2 .  BMI Body mass index is 29.39 kg/(m^2).  Wt Readings from Last 3 Encounters:  06/28/15 229 lb (103.874 kg)  05/13/14 221 lb 1.9 oz (100.299 kg)  12/08/12 223 lb 1.9 oz (101.207 kg)    General: Pleasant. Well developed, well nourished and in no acute distress. He has gained 8 pounds since last visit here.  HEENT: Normal. Neck: Supple, no JVD, carotid bruits, or masses noted.  Cardiac: Regular rate  and rhythm. No murmurs, rubs, or gallops. No edema.  Respiratory:  Lungs are clear to auscultation bilaterally with normal work of breathing.  GI: Soft and nontender.  MS: No deformity or atrophy. Gait and ROM intact. Skin: Warm and dry. Color is normal.  Neuro:  Strength and sensation are intact and no gross focal deficits noted.  Psych: Alert, appropriate and with normal affect.   LABORATORY DATA:  EKG:  EKG is ordered today. This demonstrates sinus bradycardia.  Lab Results  Component Value Date   GLUCOSE 101* 06/11/2014   CHOL 140 08/02/2014   TRIG 118.0 08/02/2014   HDL 36.20* 08/02/2014   LDLCALC 80 08/02/2014   ALT 21 08/02/2014   AST 21 08/02/2014   NA 135 06/11/2014   K 4.0 06/11/2014   CL 98 06/11/2014   CREATININE 1.02 06/11/2014   BUN 16 06/11/2014   CO2 31 06/11/2014   HGBA1C 5.7 01/21/2013    BNP (last 3  results) No results for input(s): BNP in the last 8760 hours.  ProBNP (last 3 results) No results for input(s): PROBNP in the last 8760 hours.   Other Studies Reviewed Today:   Assessment/Plan:  1. HTN - BP good on current regimen. No changes made.   2. HLD - on statin - rechecking labs today   3. Positive FH for CAD - continue with CV risk factor modification.   4. Resting bradycardia - asymptomatic   Current medicines are reviewed with the patient today.  The patient does not have concerns regarding medicines other than what has been noted above.  The following changes have been made:  See above.  Labs/ tests ordered today include:    Orders Placed This Encounter  Procedures  . Basic metabolic panel  . Hepatic function panel  . Lipid panel  . EKG 12-Lead     Disposition:   FU with me in 1 year.   Patient is agreeable to this plan and will call if any problems develop in the interim.   Signed: Rosalio MacadamiaLori C. Novella Abraha, RN, ANP-C 06/28/2015 8:49 AM  [facility] [address] [place], [place]  [address] Phone: [phone]) [phone] Fax: [phone]

## 2015-06-28 NOTE — Patient Instructions (Addendum)
We will be checking the following labs today - BMET, Lipids and HPF   Medication Instructions:    Continue with your current medicines.     Testing/Procedures To Be Arranged:  N/A  Follow-Up:   See me in one year    Other Special Instructions:   Keep up the good work!    If you need a refill on your cardiac medications before your next appointment, please call your pharmacy.   Call the [facility] office at [phone] if you have any questions, problems or concerns.

## 2015-10-04 ENCOUNTER — Encounter: Payer: Self-pay | Admitting: Nurse Practitioner

## 2015-10-24 ENCOUNTER — Other Ambulatory Visit: Payer: Self-pay | Admitting: Nurse Practitioner

## 2016-03-26 ENCOUNTER — Other Ambulatory Visit: Payer: Self-pay | Admitting: Nurse Practitioner

## 2016-06-18 ENCOUNTER — Other Ambulatory Visit: Payer: Self-pay | Admitting: Nurse Practitioner

## 2016-06-26 ENCOUNTER — Ambulatory Visit: Payer: 59 | Admitting: Nurse Practitioner

## 2016-08-22 ENCOUNTER — Ambulatory Visit: Payer: 59 | Admitting: Nurse Practitioner

## 2016-10-16 ENCOUNTER — Ambulatory Visit: Payer: 59 | Admitting: Nurse Practitioner

## 2016-11-07 ENCOUNTER — Ambulatory Visit (INDEPENDENT_AMBULATORY_CARE_PROVIDER_SITE_OTHER): Payer: 59 | Admitting: Nurse Practitioner

## 2016-11-07 ENCOUNTER — Encounter: Payer: Self-pay | Admitting: Nurse Practitioner

## 2016-11-07 VITALS — BP 118/86 | HR 64 | Ht 74.0 in | Wt 233.4 lb

## 2016-11-07 DIAGNOSIS — E7439 Other disorders of intestinal carbohydrate absorption: Secondary | ICD-10-CM

## 2016-11-07 DIAGNOSIS — I10 Essential (primary) hypertension: Secondary | ICD-10-CM

## 2016-11-07 DIAGNOSIS — E785 Hyperlipidemia, unspecified: Secondary | ICD-10-CM | POA: Diagnosis not present

## 2016-11-07 MED ORDER — LOSARTAN POTASSIUM-HCTZ 50-12.5 MG PO TABS: 1.0000 | ORAL_TABLET | Freq: Every day | ORAL | 3 refills | Status: DC

## 2016-11-07 NOTE — Progress Notes (Signed)
CARDIOLOGY OFFICE NOTE  Date:  11/07/2016    Bradley Dean Krinsky Date of Birth: 06/27/1955 Medical Record #[id number]  PCP:  Daisy Florooss, Charles Alan, MD  Cardiologist:  [facility]one Sage[facility]hardt [facility] Nahser    Chief Complaint  Patient presents with  . Hyperlipidemia  . Hypertension    1 year check - seen for Dr. Elease HashimotoNahser    History of Present Illness: Bradley Dean Molenda is a 61 y.o. male who presents today for an approximate 1 1/2 year check. Seen for Dr. Elease HashimotoNahser.   Has a positive family history of CAD, remote cath back in 2004 showing no evidence of CAD. Has HTN and HLD. Negative stress echo in 2012.   Last seen by me in June of 2017. Was doing well.   Comes back today. Here alone. He continues to do well. BP is good at home. Continues to avoid carbs. Notes he has not been as active this past year but still exercising regularly. No chest pain. Breathing is good. Working from home with [employer] - does use stand up desk. He is happy with how he is doing.    Past Medical History:  Diagnosis Date  . Chest pain    negative stress echo in 2012  . Hyperlipidemia   . Hypertension     Past Surgical History:  Procedure Laterality Date  . CARDIAC CATHETERIZATION     EF 65-&70% -- Normal left ventricular systolic function -- Smooth and normal coronary arteries. -- Vesta MixerPhilip J. Nahser, M.Dean.     Medications: Current Meds  Medication Sig  . aspirin 81 MG tablet Take 81 mg by mouth daily.    . fexofenadine (ALLEGRA) 180 MG tablet Take 180 mg 3 (three) times a week by mouth.   . FIBER PO Take 2 capsules by mouth daily.   Marland Kitchen. NASONEX 50 MCG/ACT nasal spray Place 2 sprays into the nose as needed.   . rosuvastatin (CRESTOR) 5 MG tablet TAKE 1 TABLET BY MOUTH  DAILY AT 6 PM.  . [DISCONTINUED] valsartan-hydrochlorothiazide (DIOVAN-HCT) 80-12.5 MG tablet TAKE 1 TABLET BY MOUTH  DAILY     Allergies: Allergies  Allergen Reactions  . Sulfa Drugs Cross Reactors   . Lipitor [Atorvastatin]     intolerance    Social  History: The patient  reports that  has never smoked. he has never used smokeless tobacco. He reports that he does not drink alcohol or use drugs.   Family History: The patient's family history includes Heart disease in his father.   Review of Systems: Please see the history of present illness.   Otherwise, the review of systems is positive for none.   All other systems are reviewed and negative.   Physical Exam: VS:  BP 118/86 (BP Location: Left Arm, Patient Position: Sitting, Cuff Size: Normal)   Pulse 64   Ht 6\' 2"  (1.88 m)   Wt 233 lb 6.4 oz (105.9 kg)   BMI 29.97 kg/m  .  BMI Body mass index is 29.97 kg/m.  Wt Readings from Last 3 Encounters:  11/07/16 233 lb 6.4 oz (105.9 kg)  06/28/15 229 lb (103.9 kg)  05/13/14 221 lb 1.9 oz (100.3 kg)    General: Pleasant. Well developed, well nourished and in no acute distress.   HEENT: Normal.  Neck: Supple, no JVD, carotid bruits, or masses noted.  Cardiac: Regular rate and rhythm. No murmurs, rubs, or gallops. No edema.  Respiratory:  Lungs are clear to auscultation bilaterally with normal work of breathing.  GI: Soft and nontender.  MS: No deformity or atrophy. Gait and ROM intact.  Skin: Warm and dry. Color is normal.  Neuro:  Strength and sensation are intact and no gross focal deficits noted.  Psych: Alert, appropriate and with normal affect.   LABORATORY DATA:  EKG:  EKG is ordered today. This demonstrates NSR.  Lab Results  Component Value Date   GLUCOSE 110 (H) 06/28/2015   CHOL 159 06/28/2015   TRIG 140 06/28/2015   HDL 43 06/28/2015   LDLCALC 88 06/28/2015   ALT 24 06/28/2015   AST 28 06/28/2015   NA 137 06/28/2015   K 4.8 06/28/2015   CL 99 06/28/2015   CREATININE 0.99 06/28/2015   BUN 19 06/28/2015   CO2 29 06/28/2015   HGBA1C 5.7 01/21/2013     BNP (last 3 results) No results for input(s): BNP in the last 8760 hours.  ProBNP (last 3 results) No results for input(s): PROBNP in the last 8760  hours.   Other Studies Reviewed Today:   Assessment/Plan:  1. HTN - BP is ok on current regimen. Valsartan changed today to losartan (Hyzaar 50-12.5)  He will continue to monitor. Lab today.   2. HLD - on statin - lab today.   3. Positive FH for CAD - manage with CV risk factor modification.   4. Resting bradycardia - not noted on today's EKG  5. Prior elevated glucose - checking A1C today.    Current medicines are reviewed with the patient today.  The patient does not have concerns regarding medicines other than what has been noted above.  The following changes have been made:  See above.  Labs/ tests ordered today include:    Orders Placed This Encounter  Procedures  . Basic metabolic panel  . Hepatic function panel  . Lipid panel  . Hemoglobin A1c  . EKG 12-Lead     Disposition:   FU with me in one year. Overall, he is felt to be doing very well.   Patient is agreeable to this plan and will call if any problems develop in the interim.   SignedNorma Fredrickson: Nobie Alleyne, NP  11/07/2016 9:43 AM  [facility] [address] [place], [place]  [address] Phone: [phone] Fax: [phone]

## 2016-11-07 NOTE — Patient Instructions (Addendum)
We will be checking the following labs today - BMET, HPF, Lipids and A1C   Medication Instructions:    Continue with your current medicines. BUT  I have changed your Valsartan HCTZ to Losartan HCTZ (Hyzaar) 50-12.5 daily - this has been sent to your mail order.     Testing/Procedures To Be Arranged:  N/A  Follow-Up:   See me in one year.     Other Special Instructions:   Keep a check on your BP for me - especially with the medicine change.     If you need a refill on your cardiac medications before your next appointment, please call your pharmacy.   Call the [facility] office at [phone] if you have any questions, problems or concerns.

## 2016-11-08 LAB — BASIC METABOLIC PANEL
BUN/Creatinine Ratio: 19 (ref 10–24)
BUN: 17 mg/dL (ref 8–27)
CO2: 27 mmol/L (ref 20–29)
Calcium: 10.1 mg/dL (ref 8.6–10.2)
Chloride: 97 mmol/L (ref 96–106)
Creatinine, Ser: 0.91 mg/dL (ref 0.76–1.27)
GFR calc Af Amer: 106 mL/min/{1.73_m2} (ref >59)
GFR calc non Af Amer: 91 mL/min/{1.73_m2} (ref >59)
Glucose: 113 mg/dL — ABNORMAL HIGH (ref 65–99)
Potassium: 4.5 mmol/L (ref 3.5–5.2)
Sodium: 138 mmol/L (ref 134–144)

## 2016-11-08 LAB — HEPATIC FUNCTION PANEL
ALT: 39 IU/L (ref 0–44)
AST: 30 IU/L (ref 0–40)
Albumin: 4.5 g/dL (ref 3.6–4.8)
Alkaline Phosphatase: 64 IU/L (ref 39–117)
Bilirubin Total: 0.5 mg/dL (ref 0.0–1.2)
Bilirubin, Direct: 0.1 mg/dL (ref 0.00–0.40)
Total Protein: 7.4 g/dL (ref 6.0–8.5)

## 2016-11-08 LAB — LIPID PANEL
Chol/HDL Ratio: 4 ratio (ref 0.0–5.0)
Cholesterol, Total: 165 mg/dL (ref 100–199)
HDL: 41 mg/dL (ref >39)
LDL Calculated: 102 mg/dL — ABNORMAL HIGH (ref 0–99)
Triglycerides: 108 mg/dL (ref 0–149)
VLDL Cholesterol Cal: 22 mg/dL (ref 5–40)

## 2016-11-08 LAB — HEMOGLOBIN A1C
Est. average glucose Bld gHb Est-mCnc: 117 mg/dL
Hgb A1c MFr Bld: 5.7 % — ABNORMAL HIGH (ref 4.8–5.6)

## 2017-03-17 ENCOUNTER — Other Ambulatory Visit: Payer: Self-pay | Admitting: Nurse Practitioner

## 2017-04-09 ENCOUNTER — Other Ambulatory Visit: Payer: Self-pay | Admitting: *Deleted

## 2017-04-09 ENCOUNTER — Telehealth: Payer: Self-pay | Admitting: *Deleted

## 2017-04-09 DIAGNOSIS — I10 Essential (primary) hypertension: Secondary | ICD-10-CM

## 2017-04-09 MED ORDER — LOSARTAN POTASSIUM-HCTZ 100-12.5 MG PO TABS: 1.0000 | ORAL_TABLET | Freq: Every day | ORAL | 0 refills | Status: DC

## 2017-04-09 MED ORDER — LOSARTAN POTASSIUM-HCTZ 100-12.5 MG PO TABS: 1.0000 | ORAL_TABLET | Freq: Every day | ORAL | 3 refills | Status: DC

## 2017-04-09 NOTE — Telephone Encounter (Signed)
S/w pt is aware of Lori's recommendation's will start Hyzaar (100-12.5 mg ) daily, [facility] in [facility]  Will come in on may 10 for repeat labs, orders in, appt made and linked.

## 2017-04-09 NOTE — Telephone Encounter (Signed)
lvm to f/u on mychart message.

## 2017-05-10 ENCOUNTER — Other Ambulatory Visit: Payer: 59 | Admitting: *Deleted

## 2017-05-10 DIAGNOSIS — I10 Essential (primary) hypertension: Secondary | ICD-10-CM

## 2017-05-10 LAB — BASIC METABOLIC PANEL
BUN/Creatinine Ratio: 21 (ref 10–24)
BUN: 20 mg/dL (ref 8–27)
CO2: 26 mmol/L (ref 20–29)
Calcium: 9.6 mg/dL (ref 8.6–10.2)
Chloride: 101 mmol/L (ref 96–106)
Creatinine, Ser: 0.97 mg/dL (ref 0.76–1.27)
GFR calc Af Amer: 97 mL/min/{1.73_m2} (ref >59)
GFR calc non Af Amer: 84 mL/min/{1.73_m2} (ref >59)
Glucose: 100 mg/dL — ABNORMAL HIGH (ref 65–99)
Potassium: 4.3 mmol/L (ref 3.5–5.2)
Sodium: 139 mmol/L (ref 134–144)

## 2017-10-03 ENCOUNTER — Other Ambulatory Visit: Payer: Self-pay | Admitting: *Deleted

## 2017-10-03 MED ORDER — VALSARTAN-HYDROCHLOROTHIAZIDE 80-12.5 MG PO TABS: 1.0000 | ORAL_TABLET | Freq: Every day | ORAL | 3 refills | Status: DC

## 2017-10-03 NOTE — Telephone Encounter (Signed)
S/w pt it is ok per [name], NP for pt to switch back to valsartan-HCTZ 80-12.5 mg daily, sent in to pts requested pharmacy, medication list updated.  Pt will monitor bp and call if any problems.

## 2017-10-03 NOTE — Telephone Encounter (Signed)
-----   Message from Rosalio Macadamia, NP sent at 10/03/2017  8:44 AM EDT ----- I'm assuming he is switching back? If so, ok.  Ask him to keep a check on his BP for [place].   lori ----- Message ----- From: Debbe Bales Sent: 10/03/2017   7:20 AM EDT To: Rosalio Macadamia, NP  Looks like pt was on Valsartan-HCTZ 80-12.5 is it ok to switch.  Thanks, Duwayne Heck

## 2017-10-18 ENCOUNTER — Encounter: Payer: Self-pay | Admitting: Nurse Practitioner

## 2017-11-06 ENCOUNTER — Ambulatory Visit: Payer: 59 | Admitting: Nurse Practitioner

## 2017-11-26 ENCOUNTER — Encounter: Payer: Self-pay | Admitting: Nurse Practitioner

## 2017-11-26 ENCOUNTER — Ambulatory Visit (INDEPENDENT_AMBULATORY_CARE_PROVIDER_SITE_OTHER): Payer: 59 | Admitting: Nurse Practitioner

## 2017-11-26 VITALS — BP 138/80 | HR 62 | Ht 73.0 in | Wt 227.1 lb

## 2017-11-26 DIAGNOSIS — I10 Essential (primary) hypertension: Secondary | ICD-10-CM

## 2017-11-26 DIAGNOSIS — E785 Hyperlipidemia, unspecified: Secondary | ICD-10-CM

## 2017-11-26 LAB — BASIC METABOLIC PANEL
BUN/Creatinine Ratio: 22 (ref 10–24)
BUN: 22 mg/dL (ref 8–27)
CO2: 24 mmol/L (ref 20–29)
Calcium: 9.8 mg/dL (ref 8.6–10.2)
Chloride: 96 mmol/L (ref 96–106)
Creatinine, Ser: 0.98 mg/dL (ref 0.76–1.27)
GFR calc Af Amer: 96 mL/min/{1.73_m2} (ref >59)
GFR calc non Af Amer: 83 mL/min/{1.73_m2} (ref >59)
Glucose: 107 mg/dL — ABNORMAL HIGH (ref 65–99)
Potassium: 4.3 mmol/L (ref 3.5–5.2)
Sodium: 137 mmol/L (ref 134–144)

## 2017-11-26 LAB — HEPATIC FUNCTION PANEL
ALT: 22 IU/L (ref 0–44)
AST: 16 IU/L (ref 0–40)
Albumin: 4.5 g/dL (ref 3.6–4.8)
Alkaline Phosphatase: 71 IU/L (ref 39–117)
Bilirubin Total: 0.3 mg/dL (ref 0.0–1.2)
Bilirubin, Direct: 0.1 mg/dL (ref 0.00–0.40)
Total Protein: 7.1 g/dL (ref 6.0–8.5)

## 2017-11-26 LAB — CBC
Hematocrit: 47.9 % (ref 37.5–51.0)
Hemoglobin: 16.5 g/dL (ref 13.0–17.7)
MCH: 30.8 pg (ref 26.6–33.0)
MCHC: 34.4 g/dL (ref 31.5–35.7)
MCV: 90 fL (ref 79–97)
Platelets: 179 10*3/uL (ref 150–450)
RBC: 5.35 x10E6/uL (ref 4.14–5.80)
RDW: 12.5 % (ref 12.3–15.4)
WBC: 6.9 10*3/uL (ref 3.4–10.8)

## 2017-11-26 LAB — LIPID PANEL
Chol/HDL Ratio: 4.1 ratio (ref 0.0–5.0)
Cholesterol, Total: 169 mg/dL (ref 100–199)
HDL: 41 mg/dL (ref >39)
LDL Calculated: 106 mg/dL — ABNORMAL HIGH (ref 0–99)
Triglycerides: 109 mg/dL (ref 0–149)
VLDL Cholesterol Cal: 22 mg/dL (ref 5–40)

## 2017-11-26 NOTE — Patient Instructions (Addendum)
We will be checking the following labs today - BMET, CBC, HPF and lipids  If you have labs (blood work) drawn today and your tests are completely normal, you will receive your results only by: Marland Kitchen. MyChart Message (if you have MyChart) OR . A paper copy in the mail If you have any lab test that is abnormal or we need to change your treatment, we will call you to review the results.   Medication Instructions:    Continue with your current medicines.    If you need a refill on your cardiac medications before your next appointment, please call your pharmacy.     Testing/Procedures To Be Arranged:  GXT  Follow-Up:   See me in one year.     At [facility], you and your health needs are our priority.  As part of our continuing mission to provide you with exceptional heart care, we have created designated Provider Care Teams.  These Care Teams include your primary Cardiologist (physician) and Advanced Practice Providers (APPs -  Physician Assistants and Nurse Practitioners) who all work together to provide you with the care you need, when you need it.  Special Instructions:   You are scheduled for an Exercise Stress Test on _________________________________________ at ________________________________________________.   Please arrive 15 minutes prior to your appointment time for registration and insurance purposes.   The test will take approximately 45 minutes to complete.   How to prepare for your Exercise Stress Test:   Do bring a list of your current medications with you. If not listed below, you may take your medications as normal.   Do wear comfortable closes (no dresses or overalls) and walking shoes - tennis shoes are preferred. No open toed shoes or heels.  Do not wear cologne, perfume, aftershave or lotions (deodorant is allowed).  Please report to the [address] for your test.   If you have questions or concerns about your appointment, you can  call the Exercise Stress Lab at [phone].   If you cannot keep your appointment, please provide 24 hours notification to the Exercise Stress Lab to avoid a possible $50 charge to your account.    Call the [facility] office at [phone] if you have any questions, problems or concerns.

## 2017-11-26 NOTE — Progress Notes (Signed)
CARDIOLOGY OFFICE NOTE  Date:  11/26/2017    Bradley Dean Date of Birth: 29-Aug-1955 Medical Record #[id number]  PCP:  Bradley Floro, MD  Cardiologist:  Bradley Dean & Bradley Dean    Chief Complaint  Patient presents with  . Hypertension  . Hyperlipidemia    1 year check. Seen for Dr. Elease Dean    History of Present Illness: Bradley Dean is a 62 y.o. male who presents today for a one year check. Seen for Dr. Elease Dean. Primarily follows with me.   Has a positive family history of CAD, remote cath back in 2004 showing no evidence of CAD. Other issues include HTN and HLD. Negative stress echo in 2012.   Last seen by me in November of 2018. Was doing well.   Comes back today. Here alone. His son got married back in May - actually 3 weddings due to wife being from [place] - had lots of fun and a very remarkable experience. [name] is doing well. No chest pain. Not short of breath. Continues to do keto. Weight is down. Some early morning aching of his joints but otherwise feels well. Needs labs today. BP is good at home and here. Overall he feels like he is doing well.   Past Medical History:  Diagnosis Date  . Chest pain    negative stress echo in 2012  . Hyperlipidemia   . Hypertension     Past Surgical History:  Procedure Laterality Date  . CARDIAC CATHETERIZATION     EF 65-&70% -- Normal left ventricular systolic function -- Smooth and normal coronary arteries. -- Bradley Dean, M.D.     Medications: Current Meds  Medication Sig  . aspirin 81 MG tablet Take 81 mg by mouth daily.    . fexofenadine (ALLEGRA) 180 MG tablet Take 180 mg 3 (three) times a week by mouth.   . FIBER PO Take 2 capsules by mouth daily.   Marland Kitchen NASONEX 50 MCG/ACT nasal spray Place 2 sprays into the nose as needed.   . rosuvastatin (CRESTOR) 5 MG tablet TAKE 1 TABLET BY MOUTH  DAILY AT 6 PM.  . valsartan-hydrochlorothiazide (DIOVAN-HCT) 80-12.5 MG tablet Take 1 tablet by mouth daily.      Allergies: Allergies  Allergen Reactions  . Sulfa Drugs Cross Reactors   . Lipitor [Atorvastatin]     intolerance    Social History: The patient  reports that he has never smoked. He has never used smokeless tobacco. He reports that he does not drink alcohol or use drugs.   Family History: The patient's family history includes Heart disease in his father.   Review of Systems: Please see the history of present illness.   Otherwise, the review of systems is positive for none.   All other systems are reviewed and negative.   Physical Exam: VS:  BP 138/80 (BP Location: Left Arm, Patient Position: Sitting, Cuff Size: Normal)   Pulse 62   Ht 6\' 1"  (1.854 m)   Wt 227 lb 1.9 oz (103 kg)   BMI 29.96 kg/m  .  BMI Body mass index is 29.96 kg/m.  Wt Readings from Last 3 Encounters:  11/26/17 227 lb 1.9 oz (103 kg)  11/07/16 233 lb 6.4 oz (105.9 kg)  06/28/15 229 lb (103.9 kg)    General: Pleasant. Well developed, well nourished and in no acute distress.   HEENT: Normal.  Neck: Supple, no JVD, carotid bruits, or masses noted.  Cardiac: Regular rate and rhythm. No  murmurs, rubs, or gallops. No edema.  Respiratory:  Lungs are clear to auscultation bilaterally with normal work of breathing.  GI: Soft and nontender.  MS: No deformity or atrophy. Gait and ROM intact.  Skin: Warm and dry. Color is normal.  Neuro:  Strength and sensation are intact and no gross focal deficits noted.  Psych: Alert, appropriate and with normal affect.   LABORATORY DATA:  EKG:  EKG is ordered today. This demonstrates NSR. HR is 62.   Lab Results  Component Value Date   GLUCOSE 100 (H) 05/10/2017   CHOL 165 11/07/2016   TRIG 108 11/07/2016   HDL 41 11/07/2016   LDLCALC 102 (H) 11/07/2016   ALT 39 11/07/2016   AST 30 11/07/2016   NA 139 05/10/2017   K 4.3 05/10/2017   CL 101 05/10/2017   CREATININE 0.97 05/10/2017   BUN 20 05/10/2017   CO2 26 05/10/2017   HGBA1C 5.7 (H) 11/07/2016      BNP (last 3 results) No results for input(s): BNP in the last 8760 hours.  ProBNP (last 3 results) No results for input(s): PROBNP in the last 8760 hours.   Other Studies Reviewed Today:   Assessment/Plan: 1. HTN - BP looks good. Recheck by me is 120/80.    2. HLD - he remains on low dose statin - checking lab today.   3. Positive FH for CAD - manage with CV risk factor modification. His last evaluation dates back to 2012 - would like to arrange GXT. He has no active symptoms.   4. Prior resting bradycardia - not noted today on EKG  Current medicines are reviewed with the patient today.  The patient does not have concerns regarding medicines other than what has been noted above.  The following changes have been made:  See above.  Labs/ tests ordered today include:    Orders Placed This Encounter  Procedures  . Basic metabolic panel  . CBC  . Hepatic function panel  . Lipid panel  . EXERCISE TOLERANCE TEST (ETT)  . EKG 12-Lead     Disposition:   FU with me in one year as long as his GXT is ok.   Patient is agreeable to this plan and will call if any problems develop in the interim.   SignedNorma Fredrickson: Jama Mcmiller, NP  11/26/2017 8:42 AM  [facility] [address] [place], [place]  [address] Phone: [phone] Fax: [phone]

## 2018-01-02 ENCOUNTER — Other Ambulatory Visit: Payer: Self-pay | Admitting: Nurse Practitioner

## 2018-01-07 ENCOUNTER — Ambulatory Visit (INDEPENDENT_AMBULATORY_CARE_PROVIDER_SITE_OTHER): Payer: 59

## 2018-01-07 DIAGNOSIS — E785 Hyperlipidemia, unspecified: Secondary | ICD-10-CM

## 2018-01-07 DIAGNOSIS — I10 Essential (primary) hypertension: Secondary | ICD-10-CM | POA: Diagnosis not present

## 2018-01-07 LAB — EXERCISE TOLERANCE TEST
Estimated workload: 10.1 METS
Exercise duration (min): 7 min
Exercise duration (sec): 0 s
MPHR: 159 {beats}/min
Peak HR: 157 {beats}/min
Percent HR: 98 %
RPE: 16
Rest HR: 69 {beats}/min

## 2018-03-19 ENCOUNTER — Telehealth: Payer: Self-pay | Admitting: Nurse Practitioner

## 2018-03-19 MED ORDER — VALSARTAN 80 MG PO TABS: 80.0000 mg | ORAL_TABLET | Freq: Every day | ORAL | 3 refills | Status: DC

## 2018-03-19 NOTE — Telephone Encounter (Signed)
See My Chart message from earlier today.   Recommendations for now are to continue baby aspirin with FH and his own risk factors for CAD.  Avoid alcohol to prevent gout flare.   We will stop the HCTZ in his ValsartanHCT - will start just plain Valsartan 80 mg a day. He has borderline DM - ACE/ARB is indicated. It is unclear at this time what role ACE/ARB is playing in the COVID pandemic at this time.   He is to monitor his BP for [place] - we may need to increase this dose or add Norvasc.   Rosalio Macadamia, RN, ANP-C [facility] [address] [place], [place]  [address] [phone]

## 2018-04-09 ENCOUNTER — Other Ambulatory Visit: Payer: Self-pay | Admitting: *Deleted

## 2018-04-09 MED ORDER — VALSARTAN 80 MG PO TABS: 80.0000 mg | ORAL_TABLET | Freq: Every day | ORAL | 3 refills | Status: DC

## 2018-07-26 ENCOUNTER — Other Ambulatory Visit: Payer: Self-pay | Admitting: Nurse Practitioner

## 2018-11-13 NOTE — Progress Notes (Deleted)
CARDIOLOGY OFFICE NOTE  Date:  11/13/2018    Bradley Dean Date of Birth: [birth year] Medical Record #[id number]  PCP:  Daisy Floro, MD  Cardiologist:  Tyrone Sage & ***    No chief complaint on file.   History of Present Illness: Bradley Dean is a 63 y.o. male who presents today for a *** Seen for Dr. Elease Hashimoto. Primarily follows with me.   Has a positive family history of CAD, remote cath back in 2004 showing no evidence of CAD. Other issues include HTN and HLD. Negative stress echo in 2012.   Last seenby me in November of 2018. Was doing well.   Comes back today. Here alone.His son got married back in May - actually 3 weddings due to wife being from [place] - had lots of fun and a very remarkable experience. Kaeleb is doing well. No chest pain. Not short of breath. Continues to do keto. Weight is down. Some early morning aching of his joints but otherwise feels well. Needs labs today. BP is good at home and here. Overall he feels like he is doing well.  The patient {does/does not:200015} have symptoms concerning for COVID-19 infection (fever, chills, cough, or new shortness of breath).   Comes in today. Here with   Past Medical History:  Diagnosis Date  . Chest pain    negative stress echo in 2012  . Hyperlipidemia   . Hypertension     Past Surgical History:  Procedure Laterality Date  . CARDIAC CATHETERIZATION     EF 65-&70% -- Normal left ventricular systolic function -- Smooth and normal coronary arteries. -- Vesta Mixer, M.D.     Medications: No outpatient medications have been marked as taking for the 11/18/18 encounter (Appointment) with Rosalio Macadamia, NP.     Allergies: Allergies  Allergen Reactions  . Sulfa Drugs Cross Reactors   . Lipitor [Atorvastatin]     intolerance    Social History: The patient  reports that he has never smoked. He has never used smokeless tobacco. He reports that he does not drink alcohol or use drugs.    Family History: The patient's ***family history includes Heart disease in his father.   Review of Systems: Please see the history of present illness.   All other systems are reviewed and negative.   Physical Exam: VS:  There were no vitals taken for this visit. Marland Kitchen  BMI There is no height or weight on file to calculate BMI.  Wt Readings from Last 3 Encounters:  11/26/17 227 lb 1.9 oz (103 kg)  11/07/16 233 lb 6.4 oz (105.9 kg)  06/28/15 229 lb (103.9 kg)    General: Pleasant. Well developed, well nourished and in no acute distress.   HEENT: Normal.  Neck: Supple, no JVD, carotid bruits, or masses noted.  Cardiac: ***Regular rate and rhythm. No murmurs, rubs, or gallops. No edema.  Respiratory:  Lungs are clear to auscultation bilaterally with normal work of breathing.  GI: Soft and nontender.  MS: No deformity or atrophy. Gait and ROM intact.  Skin: Warm and dry. Color is normal.  Neuro:  Strength and sensation are intact and no gross focal deficits noted.  Psych: Alert, appropriate and with normal affect.   LABORATORY DATA:  EKG:  EKG {ACTION; IS/IS JOA:[id number]} ordered today. This demonstrates ***.  Lab Results  Component Value Date   WBC 6.9 11/26/2017   HGB 16.5 11/26/2017   HCT 47.9 11/26/2017   PLT 179 11/26/2017  GLUCOSE 107 (H) 11/26/2017   CHOL 169 11/26/2017   TRIG 109 11/26/2017   HDL 41 11/26/2017   LDLCALC 106 (H) 11/26/2017   ALT 22 11/26/2017   AST 16 11/26/2017   NA 137 11/26/2017   K 4.3 11/26/2017   CL 96 11/26/2017   CREATININE 0.98 11/26/2017   BUN 22 11/26/2017   CO2 24 11/26/2017   HGBA1C 5.7 (H) 11/07/2016     BNP (last 3 results) No results for input(s): BNP in the last 8760 hours.  ProBNP (last 3 results) No results for input(s): PROBNP in the last 8760 hours.   Other Studies Reviewed Today:   Assessment/Plan: 1. HTN -BP looks good. Recheck by me is 120/80.    2. HLD -he remains on low dose statin - checking lab today.    3. Positive FH for CAD -manage with CV risk factor modification.His last evaluation dates back to 2012 - would like to arrange GXT. He has no active symptoms.   4. Prior resting bradycardia -not noted today on EKG . COVID-19 Education: The signs and symptoms of COVID-19 were discussed with the patient and how to seek care for testing (follow up with PCP or arrange E-visit).  The importance of social distancing, staying at home, hand hygiene and wearing a mask when out in public were discussed today.  Current medicines are reviewed with the patient today.  The patient does not have concerns regarding medicines other than what has been noted above.  The following changes have been made:  See above.  Labs/ tests ordered today include:   No orders of the defined types were placed in this encounter.    Disposition:   FU with *** in {gen number 0-08:676195} {Days to years:10300}.   Patient is agreeable to this plan and will call if any problems develop in the interim.   SignedTruitt Merle, NP  11/13/2018 3:12 PM  [facility] [address] [place], [place]  [address] Phone: [phone] Fax: [phone]

## 2018-11-14 ENCOUNTER — Telehealth: Payer: Self-pay

## 2018-11-14 MED ORDER — VALSARTAN 160 MG PO TABS: 160.0000 mg | ORAL_TABLET | Freq: Every day | ORAL | 3 refills | Status: DC

## 2018-11-14 NOTE — Telephone Encounter (Signed)
Patient will increase dose of Valsartan to 180 mg daily and work on restricting his salt. He will continue to monitor his BP and have labs drawn on the same day as his appointment in December.

## 2018-11-18 ENCOUNTER — Ambulatory Visit: Payer: 59 | Admitting: Nurse Practitioner

## 2018-12-17 NOTE — Progress Notes (Deleted)
CARDIOLOGY OFFICE NOTE  Date:  12/17/2018    Bradley Dean Date of Birth: [birth year] Medical Record #[id number]  PCP:  Daisy Floro, MD  Cardiologist:  Tyrone Sage & ***    No chief complaint on file.   History of Present Illness: Bradley Dean is a 63 y.o. male who presents today for a ***  Seen for Dr. Elease Hashimoto. Primarily follows with me.   Has a positive family history of CAD, remote cath back in 2004 showing no evidence of CAD. Other issues include HTN and HLD. Negative stress echo in 2012.   Last seenby me in November of 2018. Was doing well.   Comes back today. Here alone.His son got married back in May - actually 3 weddings due to wife being from [place] - had lots of fun and a very remarkable experience. Tell is doing well. No chest pain. Not short of breath. Continues to do keto. Weight is down. Some early morning aching of his joints but otherwise feels well. Needs labs today. BP is good at home and here. Overall he feels like he is doing well.  The patient {does/does not:200015} have symptoms concerning for COVID-19 infection (fever, chills, cough, or new shortness of breath).   Comes in today. Here with   Past Medical History:  Diagnosis Date  . Chest pain    negative stress echo in 2012  . Hyperlipidemia   . Hypertension     Past Surgical History:  Procedure Laterality Date  . CARDIAC CATHETERIZATION     EF 65-&70% -- Normal left ventricular systolic function -- Smooth and normal coronary arteries. -- Vesta Mixer, M.D.     Medications: No outpatient medications have been marked as taking for the 12/23/18 encounter (Appointment) with Rosalio Macadamia, NP.     Allergies: Allergies  Allergen Reactions  . Sulfa Drugs Cross Reactors   . Lipitor [Atorvastatin]     intolerance    Social History: The patient  reports that he has never smoked. He has never used smokeless tobacco. He reports that he does not drink alcohol or use drugs.     Family History: The patient's ***family history includes Heart disease in his father.   Review of Systems: Please see the history of present illness.   All other systems are reviewed and negative.   Physical Exam: VS:  There were no vitals taken for this visit. Marland Kitchen  BMI There is no height or weight on file to calculate BMI.  Wt Readings from Last 3 Encounters:  11/26/17 227 lb 1.9 oz (103 kg)  11/07/16 233 lb 6.4 oz (105.9 kg)  06/28/15 229 lb (103.9 kg)    General: Pleasant. Well developed, well nourished and in no acute distress.   HEENT: Normal.  Neck: Supple, no JVD, carotid bruits, or masses noted.  Cardiac: ***Regular rate and rhythm. No murmurs, rubs, or gallops. No edema.  Respiratory:  Lungs are clear to auscultation bilaterally with normal work of breathing.  GI: Soft and nontender.  MS: No deformity or atrophy. Gait and ROM intact.  Skin: Warm and dry. Color is normal.  Neuro:  Strength and sensation are intact and no gross focal deficits noted.  Psych: Alert, appropriate and with normal affect.   LABORATORY DATA:  EKG:  EKG {ACTION; IS/IS HKV:[id number]} ordered today. This demonstrates ***.  Lab Results  Component Value Date   WBC 6.9 11/26/2017   HGB 16.5 11/26/2017   HCT 47.9 11/26/2017   PLT  179 11/26/2017   GLUCOSE 107 (H) 11/26/2017   CHOL 169 11/26/2017   TRIG 109 11/26/2017   HDL 41 11/26/2017   LDLCALC 106 (H) 11/26/2017   ALT 22 11/26/2017   AST 16 11/26/2017   NA 137 11/26/2017   K 4.3 11/26/2017   CL 96 11/26/2017   CREATININE 0.98 11/26/2017   BUN 22 11/26/2017   CO2 24 11/26/2017   HGBA1C 5.7 (H) 11/07/2016     BNP (last 3 results) No results for input(s): BNP in the last 8760 hours.  ProBNP (last 3 results) No results for input(s): PROBNP in the last 8760 hours.   Other Studies Reviewed Today:   Assessment/Plan:  . HTN -BP looks good. Recheck by me is 120/80.    2. HLD -he remains on low dose statin - checking lab today.    3. Positive FH for CAD -manage with CV risk factor modification.His last evaluation dates back to 2012 - would like to arrange GXT. He has no active symptoms.   4. Prior resting bradycardia -not noted today on EKG . COVID-19 Education: The signs and symptoms of COVID-19 were discussed with the patient and how to seek care for testing (follow up with PCP or arrange E-visit).  The importance of social distancing, staying at home, hand hygiene and wearing a mask when out in public were discussed today.  Current medicines are reviewed with the patient today.  The patient does not have concerns regarding medicines other than what has been noted above.  The following changes have been made:  See above.  Labs/ tests ordered today include:   No orders of the defined types were placed in this encounter.    Disposition:   FU with *** in {gen number 8-18:563149} {Days to years:10300}.   Patient is agreeable to this plan and will call if any problems develop in the interim.   SignedTruitt Merle, NP  12/17/2018 8:01 AM  [facility] [address] [place], [place]  [address] Phone: [phone] Fax: [phone]

## 2018-12-23 ENCOUNTER — Ambulatory Visit: Payer: 59 | Admitting: Nurse Practitioner

## 2019-01-02 NOTE — Progress Notes (Deleted)
CARDIOLOGY OFFICE NOTE  Date:  01/02/2019    Bradley Dean Date of Birth: 1955-09-03 Medical Record #[id number]  PCP:  Bradley Floro, MD  Cardiologist:  Bradley Dean & ***    No chief complaint on file.   History of Present Illness: Bradley Dean is a 64 y.o. male who presents today for a *** Seen for Dr. Elease Dean. Primarily follows with me.   Has a positive family history of CAD, remote cath back in 2004 showing no evidence of CAD. Other issues include HTN and HLD. Negative stress echo in 2012.   Last seenby me in November of 2018. Was doing well.   Comes back today. Here alone.His son got married back in May - actually 3 weddings due to wife being from [place] - had lots of fun and a very remarkable experience. [name] is doing well. No chest pain. Not short of breath. Continues to do keto. Weight is down. Some early morning aching of his joints but otherwise feels well. Needs labs today. BP is good at home and here. Overall he feels like he is doing well.  The patient {does/does not:200015} have symptoms concerning for COVID-19 infection (fever, chills, cough, or new shortness of breath).   Comes in today. Here with   Past Medical History:  Diagnosis Date  . Chest pain    negative stress echo in 2012  . Hyperlipidemia   . Hypertension     Past Surgical History:  Procedure Laterality Date  . CARDIAC CATHETERIZATION     EF 65-&70% -- Normal left ventricular systolic function -- Smooth and normal coronary arteries. -- Bradley Dean, M.D.     Medications: No outpatient medications have been marked as taking for the 01/07/19 encounter (Appointment) with Bradley Macadamia, NP.     Allergies: Allergies  Allergen Reactions  . Sulfa Drugs Cross Reactors   . Lipitor [Atorvastatin]     intolerance    Social History: The patient  reports that he has never smoked. He has never used smokeless tobacco. He reports that he does not drink alcohol or use drugs.   Family  History: The patient's ***family history includes Heart disease in his father.   Review of Systems: Please see the history of present illness.   All other systems are reviewed and negative.   Physical Exam: VS:  There were no vitals taken for this visit. Marland Kitchen  BMI There is no height or weight on file to calculate BMI.  Wt Readings from Last 3 Encounters:  11/26/17 227 lb 1.9 oz (103 kg)  11/07/16 233 lb 6.4 oz (105.9 kg)  06/28/15 229 lb (103.9 kg)    General: Pleasant. Well developed, well nourished and in no acute distress.   HEENT: Normal.  Neck: Supple, no JVD, carotid bruits, or masses noted.  Cardiac: ***Regular rate and rhythm. No murmurs, rubs, or gallops. No edema.  Respiratory:  Lungs are clear to auscultation bilaterally with normal work of breathing.  GI: Soft and nontender.  MS: No deformity or atrophy. Gait and ROM intact.  Skin: Warm and dry. Color is normal.  Neuro:  Strength and sensation are intact and no gross focal deficits noted.  Psych: Alert, appropriate and with normal affect.   LABORATORY DATA:  EKG:  EKG {ACTION; IS/IS CXK:[id number]} ordered today. This demonstrates ***.  Lab Results  Component Value Date   WBC 6.9 11/26/2017   HGB 16.5 11/26/2017   HCT 47.9 11/26/2017   PLT 179 11/26/2017  GLUCOSE 107 (H) 11/26/2017   CHOL 169 11/26/2017   TRIG 109 11/26/2017   HDL 41 11/26/2017   LDLCALC 106 (H) 11/26/2017   ALT 22 11/26/2017   AST 16 11/26/2017   NA 137 11/26/2017   K 4.3 11/26/2017   CL 96 11/26/2017   CREATININE 0.98 11/26/2017   BUN 22 11/26/2017   CO2 24 11/26/2017   HGBA1C 5.7 (H) 11/07/2016     BNP (last 3 results) No results for input(s): BNP in the last 8760 hours.  ProBNP (last 3 results) No results for input(s): PROBNP in the last 8760 hours.   Other Studies Reviewed Today:   Assessment/Plan: 1. HTN -BP looks good. Recheck by me is 120/80.    2. HLD -he remains on low dose statin - checking lab today.   3.  Positive FH for CAD -manage with CV risk factor modification.His last evaluation dates back to 2012 - would like to arrange GXT. He has no active symptoms.   4. Prior resting bradycardia -not noted today on EKG . COVID-19 Education: The signs and symptoms of COVID-19 were discussed with the patient and how to seek care for testing (follow up with PCP or arrange E-visit).  The importance of social distancing, staying at home, hand hygiene and wearing a mask when out in public were discussed today.  Current medicines are reviewed with the patient today.  The patient does not have concerns regarding medicines other than what has been noted above.  The following changes have been made:  See above.  Labs/ tests ordered today include:   No orders of the defined types were placed in this encounter.    Disposition:   FU with *** in {gen number 2-68:341962} {Days to years:10300}.   Patient is agreeable to this plan and will call if any problems develop in the interim.   SignedTruitt Merle, NP  01/02/2019 9:53 AM  [facility] [address] [place], [place]  [address] Phone: [phone] Fax: [phone]

## 2019-01-07 ENCOUNTER — Ambulatory Visit: Payer: 59 | Admitting: Nurse Practitioner

## 2019-01-28 NOTE — Progress Notes (Signed)
CARDIOLOGY OFFICE NOTE  Date:  02/03/2019    Bradley Dean Date of Birth: 19-Feb-1955 Medical Record #[id number]  PCP:  Lawerance Cruel, MD  Cardiologist:  Servando Snare [facility]    Chief Complaint  Patient presents with  . Follow-up    Seen for Dr. Acie Fredrickson    History of Present Illness: Bradley Dean is a 64 y.o. male who presents today for a 15 month check.  Seen for Dr. Acie Fredrickson. Primarily follows with me.   Has a positive family history of CAD, remote cath back in 2004 showing no evidence of CAD. Other issues include HTN and HLD. Negative stress echo in 2012.   Last seen by me in November of 2019 - was doing well. BP good at home.   The patient does not have symptoms concerning for COVID-19 infection (fever, chills, cough, or new shortness of breath).   Comes in today. Here alone. He is doing well. Has had some challenges with the pandemic - working from home. Doing well. Riding his bike. Still doing keto. Weight is down a few pounds. No chest pain. Breathing is good. For his physical soon.    Past Medical History:  Diagnosis Date  . Chest pain    negative stress echo in 2012  . Hyperlipidemia   . Hypertension     Past Surgical History:  Procedure Laterality Date  . CARDIAC CATHETERIZATION     EF 65-&70% -- Normal left ventricular systolic function -- Smooth and normal coronary arteries. -- Thayer Headings, M.D.     Medications: Current Meds  Medication Sig  . aspirin 81 MG tablet Take 81 mg by mouth daily.    . fexofenadine (ALLEGRA) 180 MG tablet Take 180 mg 3 (three) times a week by mouth.   . FIBER PO Take 2 capsules by mouth daily.   Marland Kitchen NASONEX 50 MCG/ACT nasal spray Place 2 sprays into the nose as needed.   . rosuvastatin (CRESTOR) 5 MG tablet TAKE 1 TABLET BY MOUTH  DAILY AT 6 PM.  . valsartan (DIOVAN) 160 MG tablet Take 1 tablet (160 mg total) by mouth daily.  . [DISCONTINUED] valsartan (DIOVAN) 160 MG tablet Take 1 tablet (160 mg total) by mouth daily.     Allergies: Allergies  Allergen Reactions  . Sulfa Drugs Cross Reactors   . Lipitor [Atorvastatin]     intolerance    Social History: The patient  reports that he has never smoked. He has never used smokeless tobacco. He reports that he does not drink alcohol or use drugs.   Family History: The patient's family history includes Heart disease in his father.   Review of Systems: Please see the history of present illness.   All other systems are reviewed and negative.   Physical Exam: VS:  BP 134/78   Pulse (!) 57   Ht 6\' 1"  (1.854 m)   Wt 223 lb 12.8 oz (101.5 kg)   SpO2 94%   BMI 29.53 kg/m  .  BMI Body mass index is 29.53 kg/m.  Wt Readings from Last 3 Encounters:  02/03/19 223 lb 12.8 oz (101.5 kg)  11/26/17 227 lb 1.9 oz (103 kg)  11/07/16 233 lb 6.4 oz (105.9 kg)    General: Pleasant. Well developed, well nourished and in no acute distress.   HEENT: Normal.  Neck: Supple, no JVD, carotid bruits, or masses noted.  Cardiac: Regular rate and rhythm. No murmurs, rubs, or gallops. No edema.  Respiratory:  Lungs are clear to auscultation bilaterally with normal work of breathing.  GI: Soft and nontender.  MS: No deformity or atrophy. Gait and ROM intact.  Skin: Warm and dry. Color is normal.  Neuro:  Strength and sensation are intact and no gross focal deficits noted.  Psych: Alert, appropriate and with normal affect.   LABORATORY DATA:  EKG:  EKG is ordered today. This demonstrates sinus bradycardia - HR is 57 - baseline artifact noted - no acute changes.  Lab Results  Component Value Date   WBC 6.9 11/26/2017   HGB 16.5 11/26/2017   HCT 47.9 11/26/2017   PLT 179 11/26/2017   GLUCOSE 107 (H) 11/26/2017   CHOL 169 11/26/2017   TRIG 109 11/26/2017   HDL 41 11/26/2017   LDLCALC 106 (H) 11/26/2017   ALT 22 11/26/2017   AST 16 11/26/2017   NA 137 11/26/2017   K 4.3 11/26/2017   CL 96 11/26/2017   CREATININE 0.98 11/26/2017   BUN 22 11/26/2017   CO2 24  11/26/2017   HGBA1C 5.7 (H) 11/07/2016       BNP (last 3 results) No results for input(s): BNP in the last 8760 hours.  ProBNP (last 3 results) No results for input(s): PROBNP in the last 8760 hours.   Other Studies Reviewed Today:  Myoview Study Highlights 01/2018   The patient walked for 7:00 of a Bruce protocol GXT. He achieved a peark HR of 157 which is 98% predicted maximal HR .  At peak exercise, there were no ST or T wave changes to suggest ischemia .  Blood pressure demonstrated a hypertensive response to exercise.  This is interpreted as a negative GXT. There is no evidence of ischemia .      Assessment/Plan:  1. HTN - BP is fine - no changes made today. Continue with CV risk factor modification.   2. HLD - on statin - lab today.   3. +FH for CAD - continue with aggressive CV risk factor modification. Low risk Myoview from 2020.   4. History of resting bradycardia - he is not symptomatic. HR fine today.   5. COVID-19 Education: The signs and symptoms of COVID-19 were discussed with the patient and how to seek care for testing (follow up with PCP or arrange E-visit).  The importance of social distancing, staying at home, hand hygiene and wearing a mask when out in public were discussed today.  Current medicines are reviewed with the patient today.  The patient does not have concerns regarding medicines other than what has been noted above.  The following changes have been made:  See above.  Labs/ tests ordered today include:    Orders Placed This Encounter  Procedures  . Basic metabolic panel  . CBC  . Hepatic function panel  . Lipid panel  . EKG 12-Lead     Disposition:   FU with [place] in one year.   Patient is agreeable to this plan and will call if any problems develop in the interim.   SignedNorma Fredrickson, NP  02/03/2019 8:43 AM  [facility] [address] [place], [place]  [address] Phone: [phone] Fax: [phone]

## 2019-02-03 ENCOUNTER — Other Ambulatory Visit: Payer: Self-pay

## 2019-02-03 ENCOUNTER — Encounter: Payer: Self-pay | Admitting: Nurse Practitioner

## 2019-02-03 ENCOUNTER — Ambulatory Visit (INDEPENDENT_AMBULATORY_CARE_PROVIDER_SITE_OTHER): Payer: 59 | Admitting: Nurse Practitioner

## 2019-02-03 VITALS — BP 134/78 | HR 57 | Ht 73.0 in | Wt 223.8 lb

## 2019-02-03 DIAGNOSIS — Z8249 Family history of ischemic heart disease and other diseases of the circulatory system: Secondary | ICD-10-CM

## 2019-02-03 DIAGNOSIS — E785 Hyperlipidemia, unspecified: Secondary | ICD-10-CM | POA: Diagnosis not present

## 2019-02-03 DIAGNOSIS — I10 Essential (primary) hypertension: Secondary | ICD-10-CM

## 2019-02-03 DIAGNOSIS — Z8679 Personal history of other diseases of the circulatory system: Secondary | ICD-10-CM

## 2019-02-03 DIAGNOSIS — Z7189 Other specified counseling: Secondary | ICD-10-CM

## 2019-02-03 LAB — HEPATIC FUNCTION PANEL
ALT: 15 IU/L (ref 0–44)
AST: 16 IU/L (ref 0–40)
Albumin: 4.3 g/dL (ref 3.8–4.8)
Alkaline Phosphatase: 75 IU/L (ref 39–117)
Bilirubin Total: 0.3 mg/dL (ref 0.0–1.2)
Bilirubin, Direct: 0.1 mg/dL (ref 0.00–0.40)
Total Protein: 7 g/dL (ref 6.0–8.5)

## 2019-02-03 LAB — CBC
Hematocrit: 46.5 % (ref 37.5–51.0)
Hemoglobin: 15.8 g/dL (ref 13.0–17.7)
MCH: 30.4 pg (ref 26.6–33.0)
MCHC: 34 g/dL (ref 31.5–35.7)
MCV: 89 fL (ref 79–97)
Platelets: 160 10*3/uL (ref 150–450)
RBC: 5.2 x10E6/uL (ref 4.14–5.80)
RDW: 12.4 % (ref 11.6–15.4)
WBC: 5.6 10*3/uL (ref 3.4–10.8)

## 2019-02-03 LAB — LIPID PANEL
Chol/HDL Ratio: 3.8 ratio (ref 0.0–5.0)
Cholesterol, Total: 148 mg/dL (ref 100–199)
HDL: 39 mg/dL — ABNORMAL LOW (ref >39)
LDL Chol Calc (NIH): 90 mg/dL (ref 0–99)
Triglycerides: 100 mg/dL (ref 0–149)
VLDL Cholesterol Cal: 19 mg/dL (ref 5–40)

## 2019-02-03 LAB — BASIC METABOLIC PANEL
BUN/Creatinine Ratio: 15 (ref 10–24)
BUN: 15 mg/dL (ref 8–27)
CO2: 24 mmol/L (ref 20–29)
Calcium: 9.8 mg/dL (ref 8.6–10.2)
Chloride: 101 mmol/L (ref 96–106)
Creatinine, Ser: 0.99 mg/dL (ref 0.76–1.27)
GFR calc Af Amer: 94 mL/min/{1.73_m2} (ref >59)
GFR calc non Af Amer: 81 mL/min/{1.73_m2} (ref >59)
Glucose: 102 mg/dL — ABNORMAL HIGH (ref 65–99)
Potassium: 4.7 mmol/L (ref 3.5–5.2)
Sodium: 139 mmol/L (ref 134–144)

## 2019-02-03 MED ORDER — VALSARTAN 160 MG PO TABS: 160.0000 mg | ORAL_TABLET | Freq: Every day | ORAL | 3 refills | Status: DC

## 2019-02-03 NOTE — Patient Instructions (Addendum)
After Visit Summary:  We will be checking the following labs today - BMET, CBC, HPF and lipids   Medication Instructions:    Continue with your current medicines.    If you need a refill on your cardiac medications before your next appointment, please call your pharmacy.     Testing/Procedures To Be Arranged:  N/A  Follow-Up:   See [place] back in one year -  You will receive a reminder letter in the mail two months in advance. If you don't receive a letter, please call our office to schedule the follow-up appointment.     At [facility], you and your health needs are our priority.  As part of our continuing mission to provide you with exceptional heart care, we have created designated Provider Care Teams.  These Care Teams include your primary Cardiologist (physician) and Advanced Practice Providers (APPs -  Physician Assistants and Nurse Practitioners) who all work together to provide you with the care you need, when you need it.  Special Instructions:  . Stay safe, stay home, wash your hands for at least 20 seconds and wear a mask when out in public.  . It was good to talk with you today.    Call the [facility] office at [phone] if you have any questions, problems or concerns.

## 2019-10-19 ENCOUNTER — Other Ambulatory Visit: Payer: Self-pay | Admitting: Nurse Practitioner

## 2019-12-25 ENCOUNTER — Other Ambulatory Visit: Payer: Self-pay | Admitting: Nurse Practitioner

## 2019-12-25 DIAGNOSIS — E785 Hyperlipidemia, unspecified: Secondary | ICD-10-CM

## 2019-12-25 DIAGNOSIS — I10 Essential (primary) hypertension: Secondary | ICD-10-CM

## 2020-02-15 ENCOUNTER — Telehealth: Payer: Self-pay | Admitting: Cardiovascular Disease

## 2020-02-15 DIAGNOSIS — E785 Hyperlipidemia, unspecified: Secondary | ICD-10-CM

## 2020-02-15 DIAGNOSIS — I10 Essential (primary) hypertension: Secondary | ICD-10-CM

## 2020-02-15 NOTE — Telephone Encounter (Signed)
Please get lipids, ALT, BMP, Hb A1C, prior  to is visit

## 2020-02-15 NOTE — Telephone Encounter (Signed)
  Patient would like to have lab orders placed for his lipid and A1C before his annual  appt in March with Dr Elease Hashimoto.

## 2020-02-15 NOTE — Telephone Encounter (Signed)
Mailbox is full - could not leave a message.  Will try again later.

## 2020-03-14 ENCOUNTER — Other Ambulatory Visit: Payer: Self-pay | Admitting: *Deleted

## 2020-03-14 DIAGNOSIS — E785 Hyperlipidemia, unspecified: Secondary | ICD-10-CM

## 2020-03-14 DIAGNOSIS — I10 Essential (primary) hypertension: Secondary | ICD-10-CM

## 2020-03-14 MED ORDER — VALSARTAN 160 MG PO TABS: 160.0000 mg | ORAL_TABLET | Freq: Every day | ORAL | 0 refills | Status: DC

## 2020-03-29 ENCOUNTER — Ambulatory Visit: Payer: 59 | Admitting: Cardiovascular Disease

## 2020-04-19 ENCOUNTER — Ambulatory Visit: Payer: 59 | Admitting: Cardiovascular Disease

## 2020-05-11 ENCOUNTER — Other Ambulatory Visit: Payer: Self-pay

## 2020-05-11 ENCOUNTER — Encounter: Payer: Self-pay | Admitting: Cardiovascular Disease

## 2020-05-11 ENCOUNTER — Ambulatory Visit (INDEPENDENT_AMBULATORY_CARE_PROVIDER_SITE_OTHER): Payer: 59 | Admitting: Cardiovascular Disease

## 2020-05-11 ENCOUNTER — Other Ambulatory Visit: Payer: 59 | Admitting: *Deleted

## 2020-05-11 DIAGNOSIS — Z8249 Family history of ischemic heart disease and other diseases of the circulatory system: Secondary | ICD-10-CM | POA: Diagnosis not present

## 2020-05-11 DIAGNOSIS — I10 Essential (primary) hypertension: Secondary | ICD-10-CM

## 2020-05-11 DIAGNOSIS — E782 Mixed hyperlipidemia: Secondary | ICD-10-CM

## 2020-05-11 DIAGNOSIS — E785 Hyperlipidemia, unspecified: Secondary | ICD-10-CM | POA: Insufficient documentation

## 2020-05-11 NOTE — Progress Notes (Signed)
Cardiology Office Note:    Date:  05/11/2020   ID:  Bradley Dean, DOB 1955-02-07, MRN [id number]  PCP:  Bradley Floro, MD   [facility] Providers Cardiologist:  None     Referring MD: Bradley Floro, MD   Chief Complaint  Patient presents with  . Hyperlipidemia  . Hypertension    May 11, 2020   Bradley Dean is a 65 y.o. male with a hx of HTN, HLD I last saw Bradley Dean in 2012. He has been seeing Bradley Fredrickson, NP Family hx of CAD .  Had a remote cath in 2004  Works for [employer]. No CP or dyspnea  Does a fair amount of cycling without any difficulty  Has some orthostatic hypotension  Has had borderline hyperglycemia   BP has been well controlled.  . Has a + family hx of CAD Father died in his 58s of MI Brother died in his 62 s   Did not tolerate atorvastatin ( muscle aches)  Helped with Co-Q 10  Tolerates a low dose  rosuvastatin     Past Medical History:  Diagnosis Date  . Chest pain    negative stress echo in 2012  . Hyperlipidemia   . Hypertension     Past Surgical History:  Procedure Laterality Date  . CARDIAC CATHETERIZATION     EF 65-&70% -- Normal left ventricular systolic function -- Smooth and normal coronary arteries. -- Bradley Dean, M.D.    Current Medications: Current Meds  Medication Sig  . aspirin 81 MG tablet Take 81 mg by mouth daily.  . Cholecalciferol (VITAMIN D3) 125 MCG (5000 UT) CAPS See admin instructions.  . Coenzyme Q10 (CO Q-10) 100 MG CAPS See admin instructions.  Marland Kitchen FIBER PO Take 2 capsules by mouth daily.  Marland Kitchen loratadine (CLARITIN) 5 MG chewable tablet See admin instructions.  . rosuvastatin (CRESTOR) 5 MG tablet TAKE 1 TABLET BY MOUTH  DAILY AT 6 PM.  . valsartan (DIOVAN) 160 MG tablet Take 1 tablet (160 mg total) by mouth daily. Please schedule office visit for future refills. Thank you     Allergies:   Sulfa drugs cross reactors and Lipitor [atorvastatin]   Social History   Socioeconomic  History  . Marital status: Married    Spouse name: Not on file  . Number of children: Not on file  . Years of education: Not on file  . Highest education level: Not on file  Occupational History  . Not on file  Tobacco Use  . Smoking status: Never Smoker  . Smokeless tobacco: Never Used  Vaping Use  . Vaping Use: Never used  Substance and Sexual Activity  . Alcohol use: No  . Drug use: No  . Sexual activity: Not on file  Other Topics Concern  . Not on file  Social History Narrative  . Not on file   Social Determinants of Health   Financial Resource Strain: Not on file  Food Insecurity: Not on file  Transportation Needs: Not on file  Physical Activity: Not on file  Stress: Not on file  Social Connections: Not on file     Family History: The patient's family history includes Heart disease in his father.  ROS:   Please see the history of present illness.     All other systems reviewed and are negative.  EKGs/Labs/Other Studies Reviewed:    The following studies were reviewed today:   EKG: May 11, 2020: Sinus bradycardia at 58  beats minute.  Frequent premature ventricular contractions.  Recent Labs: No results found for requested labs within last 8760 hours.  Recent Lipid Panel    Component Value Date/Time   CHOL 148 02/03/2019 0904   TRIG 100 02/03/2019 0904   HDL 39 (L) 02/03/2019 0904   CHOLHDL 3.8 02/03/2019 0904   CHOLHDL 3.7 06/28/2015 0857   VLDL 28 06/28/2015 0857   LDLCALC 90 02/03/2019 0904     Risk Assessment/Calculations:       Physical Exam:    VS:  BP 130/90   Pulse (!) 59   Ht 6\' 1"  (1.854 m)   Wt 202 lb 6.4 oz (91.8 kg)   SpO2 99%   BMI 26.70 kg/m     Wt Readings from Last 3 Encounters:  05/11/20 202 lb 6.4 oz (91.8 kg)  02/03/19 223 lb 12.8 oz (101.5 kg)  11/26/17 227 lb 1.9 oz (103 kg)     GEN:  Well nourished, well developed in no acute distress HEENT: Normal NECK: No JVD; No carotid bruits LYMPHATICS: No  lymphadenopathy CARDIAC: RRR, no murmurs, rubs, gallops RESPIRATORY:  Clear to auscultation without rales, wheezing or rhonchi  ABDOMEN: Soft, non-tender, non-distended MUSCULOSKELETAL:  No edema; No deformity  SKIN: Warm and dry NEUROLOGIC:  Alert and oriented x 3 PSYCHIATRIC:  Normal affect   ASSESSMENT:    No diagnosis found. PLAN:    In order of problems listed above:  1. Hyperlipidemia: 11/28/17 presents for further evaluation management of his hyperlipidemia.  He has a strong family history of coronary artery disease including his father died in his 53s and his brother died in his 24s due to coronary artery disease.  56s has been on low-dose rosuvastatin but is LDL cholesterol remains elevated at 135.  We will schedule him for a coronary calcium score for further evaluation.  If his coronary calcium score is high I would have a very low threshold to proceed with Lexiscan Myoview study for further evaluation.  We would also want to refer him to the lipid clinic for consideration of a PCSK9 inhibitor.  2.  Premature ventricular contractions.  While these are likely to be benign PVCs, they are also could indicate the presence of coronary artery disease.  As mentioned above, I would have a low threshold to proceed with Lexiscan Myoview study if his coronary calcium score is extremely high.        Medication Adjustments/Labs and Tests Ordered: Current medicines are reviewed at length with the patient today.  Concerns regarding medicines are outlined above.  Orders Placed This Encounter  Procedures  . CT CARDIAC SCORING (SELF PAY ONLY)   No orders of the defined types were placed in this encounter.   Patient Instructions  Medication Instructions:  Your physician recommends that you continue on your current medications as directed. Please refer to the Current Medication list given to you today.  *If you need a refill on your cardiac medications before your next appointment,  please call your pharmacy*   Lab Work: none If you have labs (blood work) drawn today and your tests are completely normal, you will receive your results only by: Arlys John MyChart Message (if you have MyChart) OR . A paper copy in the mail If you have any lab test that is abnormal or we need to change your treatment, we will call you to review the results.   Testing/Procedures: Your physician has recommended you have a coronary calcium score completed.    Follow-Up: At [facility], you and your health needs are our priority.  As part of our continuing mission to provide you with exceptional heart care, we have created designated Provider Care Teams.  These Care Teams include your primary Cardiologist (physician) and Advanced Practice Providers (APPs -  Physician Assistants and Nurse Practitioners) who all work together to provide you with the care you need, when you need it.   Your next appointment:   6 month(s)  The format for your next appointment:   In Person  Provider:   You may see Dr. Elease Hashimoto or one of the following Advanced Practice Providers on your designated Care Team:    [name], PA-C  [name], [place]    Other Instructions none     Signed, [name], MD  05/11/2020 5:26 PM    [facility]

## 2020-05-11 NOTE — Patient Instructions (Signed)
Medication Instructions:  Your physician recommends that you continue on your current medications as directed. Please refer to the Current Medication list given to you today.  *If you need a refill on your cardiac medications before your next appointment, please call your pharmacy*   Lab Work: none If you have labs (blood work) drawn today and your tests are completely normal, you will receive your results only by: Marland Kitchen MyChart Message (if you have MyChart) OR . A paper copy in the mail If you have any lab test that is abnormal or we need to change your treatment, we will call you to review the results.   Testing/Procedures: Your physician has recommended you have a coronary calcium score completed.    Follow-Up: At [facility], you and your health needs are our priority.  As part of our continuing mission to provide you with exceptional heart care, we have created designated Provider Care Teams.  These Care Teams include your primary Cardiologist (physician) and Advanced Practice Providers (APPs -  Physician Assistants and Nurse Practitioners) who all work together to provide you with the care you need, when you need it.   Your next appointment:   6 month(s)  The format for your next appointment:   In Person  Provider:   You may see Dr. Elease Hashimoto or one of the following Advanced Practice Providers on your designated Care Team:    [name], PA-C  [name], [place]    Other Instructions none

## 2020-05-12 DIAGNOSIS — E782 Mixed hyperlipidemia: Secondary | ICD-10-CM

## 2020-05-12 DIAGNOSIS — Z8249 Family history of ischemic heart disease and other diseases of the circulatory system: Secondary | ICD-10-CM

## 2020-05-12 LAB — LIPID PANEL
Chol/HDL Ratio: 4.1 ratio (ref 0.0–5.0)
Cholesterol, Total: 203 mg/dL — ABNORMAL HIGH (ref 100–199)
HDL: 50 mg/dL (ref >39)
LDL Chol Calc (NIH): 139 mg/dL — ABNORMAL HIGH (ref 0–99)
Triglycerides: 78 mg/dL (ref 0–149)
VLDL Cholesterol Cal: 14 mg/dL (ref 5–40)

## 2020-05-12 LAB — BASIC METABOLIC PANEL
BUN/Creatinine Ratio: 20 (ref 10–24)
BUN: 19 mg/dL (ref 8–27)
CO2: 24 mmol/L (ref 20–29)
Calcium: 10.1 mg/dL (ref 8.6–10.2)
Chloride: 99 mmol/L (ref 96–106)
Creatinine, Ser: 0.94 mg/dL (ref 0.76–1.27)
Glucose: 95 mg/dL (ref 65–99)
Potassium: 4.5 mmol/L (ref 3.5–5.2)
Sodium: 138 mmol/L (ref 134–144)
eGFR: 91 mL/min/{1.73_m2} (ref >59)

## 2020-05-12 LAB — ALT: ALT: 17 IU/L (ref 0–44)

## 2020-05-12 LAB — HEMOGLOBIN A1C
Est. average glucose Bld gHb Est-mCnc: 123 mg/dL
Hgb A1c MFr Bld: 5.9 % — ABNORMAL HIGH (ref 4.8–5.6)

## 2020-05-13 ENCOUNTER — Other Ambulatory Visit: Payer: Self-pay

## 2020-05-13 ENCOUNTER — Ambulatory Visit (INDEPENDENT_AMBULATORY_CARE_PROVIDER_SITE_OTHER)
Admission: RE | Admit: 2020-05-13 | Discharge: 2020-05-13 | Disposition: A | Discharge status: Home / Self Care | Payer: Self-pay | Source: Ambulatory Visit | Attending: Cardiovascular Disease | Admitting: Cardiovascular Disease

## 2020-05-13 DIAGNOSIS — E782 Mixed hyperlipidemia: Secondary | ICD-10-CM

## 2020-05-13 NOTE — Addendum Note (Signed)
Addended by: Anselm Pancoast R on: 05/13/2020 11:51 AM   Modules accepted: Orders

## 2020-05-16 ENCOUNTER — Telehealth: Payer: Self-pay

## 2020-05-16 ENCOUNTER — Telehealth: Payer: Self-pay | Admitting: Cardiovascular Disease

## 2020-05-16 DIAGNOSIS — Z8249 Family history of ischemic heart disease and other diseases of the circulatory system: Secondary | ICD-10-CM

## 2020-05-16 DIAGNOSIS — R9389 Abnormal findings on diagnostic imaging of other specified body structures: Secondary | ICD-10-CM

## 2020-05-16 NOTE — Telephone Encounter (Signed)
Pt is calling in regards to a MyChart message sent Friday, he wants to make sure someone saw his message

## 2020-05-16 NOTE — Telephone Encounter (Signed)
Message is being handled through MyChart.

## 2020-05-17 ENCOUNTER — Other Ambulatory Visit: Payer: Self-pay

## 2020-05-17 ENCOUNTER — Ambulatory Visit (HOSPITAL_COMMUNITY): Payer: 59 | Attending: Cardiology

## 2020-05-17 DIAGNOSIS — Z8249 Family history of ischemic heart disease and other diseases of the circulatory system: Secondary | ICD-10-CM | POA: Insufficient documentation

## 2020-05-17 DIAGNOSIS — R079 Chest pain, unspecified: Secondary | ICD-10-CM | POA: Diagnosis not present

## 2020-05-17 DIAGNOSIS — R931 Abnormal findings on diagnostic imaging of heart and coronary circulation: Secondary | ICD-10-CM

## 2020-05-17 DIAGNOSIS — R9389 Abnormal findings on diagnostic imaging of other specified body structures: Secondary | ICD-10-CM | POA: Diagnosis not present

## 2020-05-17 LAB — MYOCARDIAL PERFUSION IMAGING
LV dias vol: 105 mL (ref 62–150)
LV sys vol: 40 mL
Peak HR: 103 {beats}/min
Rest HR: 77 {beats}/min
SDS: 0
SRS: 0
SSS: 0
TID: 1.04

## 2020-05-17 MED ORDER — TECHNETIUM TC 99M TETROFOSMIN IV KIT
10.1000 | PACK | Freq: Once | INTRAVENOUS | Status: AC | PRN
Start: 2020-05-17 — End: 2020-05-17
  Administered 2020-05-17: 10.1 via INTRAVENOUS
  Filled 2020-05-17: qty 11

## 2020-05-17 MED ORDER — TECHNETIUM TC 99M TETROFOSMIN IV KIT
30.3000 | PACK | Freq: Once | INTRAVENOUS | Status: AC | PRN
Start: 2020-05-17 — End: 2020-05-17
  Administered 2020-05-17: 30.3 via INTRAVENOUS
  Filled 2020-05-17: qty 31

## 2020-05-17 MED ORDER — REGADENOSON 0.4 MG/5ML IV SOLN
0.4000 mg | Freq: Once | INTRAVENOUS | Status: AC
  Administered 2020-05-17: 0.4 mg via INTRAVENOUS

## 2020-05-18 NOTE — Telephone Encounter (Signed)
m °

## 2020-05-20 ENCOUNTER — Telehealth: Payer: Self-pay

## 2020-05-20 DIAGNOSIS — Z8249 Family history of ischemic heart disease and other diseases of the circulatory system: Secondary | ICD-10-CM

## 2020-05-20 NOTE — Telephone Encounter (Signed)
-----   Message from Lendon Ka, RN sent at 05/19/2020  2:13 PM EDT ----- Dr. Elease Hashimoto patient.

## 2020-05-20 NOTE — Telephone Encounter (Signed)
RN spoke to patient regarding results and mychart message. Patient verbalzied understanding but would like to know Dr. Namon Cirri advice regarding the dilated aorta. Patient would like to come into the office to discuss all of these results or do a telephone call if necessary. RN advised that Dr. Elease Hashimoto is out of the office until May 31st. Patient verbalized understanding. Tentative appointment scheduled on 7/19 as it was the first available. RN advised patient I would place him on the cancellation list. Patient verbalized understanding and thanked Charity fundraiser for calling.

## 2020-05-24 ENCOUNTER — Other Ambulatory Visit: Payer: Self-pay | Admitting: Cardiovascular Disease

## 2020-05-24 DIAGNOSIS — I10 Essential (primary) hypertension: Secondary | ICD-10-CM

## 2020-05-24 DIAGNOSIS — E785 Hyperlipidemia, unspecified: Secondary | ICD-10-CM

## 2020-05-31 NOTE — Telephone Encounter (Signed)
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 He has no angina symptoms Will have him go to lipid clinic for aggressive lipid lowering   He also has pulsitile tinnitus. Will schedule him for a carotid duplex study in the next several weeks.   He has a follow up appt with me in July .   [name], MD  05/31/2020 5:59 PM    [facility] [address] [place],  [address] [place], [place]  [address] Phone: [phone]; Fax: [phone]

## 2020-06-01 ENCOUNTER — Other Ambulatory Visit: Payer: Self-pay

## 2020-06-01 ENCOUNTER — Ambulatory Visit (INDEPENDENT_AMBULATORY_CARE_PROVIDER_SITE_OTHER): Payer: 59 | Admitting: Pharmacist

## 2020-06-01 DIAGNOSIS — E782 Mixed hyperlipidemia: Secondary | ICD-10-CM

## 2020-06-01 DIAGNOSIS — I25709 Atherosclerosis of coronary artery bypass graft(s), unspecified, with unspecified angina pectoris: Secondary | ICD-10-CM | POA: Diagnosis not present

## 2020-06-01 MED ORDER — PROPRANOLOL HCL 10 MG PO TABS
10.0000 mg | ORAL_TABLET | Freq: Four times a day (QID) | ORAL | 0 refills | Status: DC | PRN
Start: 2020-06-01 — End: 2020-08-16

## 2020-06-01 MED ORDER — ROSUVASTATIN CALCIUM 10 MG PO TABS: ORAL_TABLET | ORAL | 3 refills | Status: DC

## 2020-06-01 MED ORDER — PROPRANOLOL HCL 10 MG PO TABS: 10.0000 mg | ORAL_TABLET | Freq: Four times a day (QID) | ORAL | 3 refills | Status: DC | PRN

## 2020-06-01 MED ORDER — REPATHA SURECLICK 140 MG/ML ~~LOC~~ SOAJ: 1.0000 "pen " | SUBCUTANEOUS | 11 refills | Status: DC

## 2020-06-01 NOTE — Telephone Encounter (Signed)
Orders entered for carotid duplex as well as propranolol as needed for palpitations. Patient notified via previous MyChart encounter.

## 2020-06-01 NOTE — Addendum Note (Signed)
Addended by: Terrilyn Saver on: 06/01/2020 02:35 PM   Modules accepted: Orders

## 2020-06-01 NOTE — Progress Notes (Signed)
Patient ID: Bradley Dean                 DOB: June 03, 1955                    MRN: [id number]     HPI: Bradley Dean is a 65 y.o. male patient referred to lipid clinic by Dr Elease Hashimoto. PMH is significant for HTN, HLD, PVCs, chest pain with negative stress echo in 2012, and family history of premature CAD. He underwent coronary calcium scoring on 05/13/20 which was 1646 and 97th percentile for age and sex. He then underwent stress test 05/17/20 which showed normal EF of 55-65%, fixed apical inferior perfusion defect with normal wall motion, consistent with artifact, and was overall a low risk study.  Pt presents today in good spirits with his wife. He has been tolerating rosuvastatin well since he added CoQ10. Previously experienced muscle/joint pain before adding CoQ10 and with atorvastatin in the past. Has been keeping an eye on his fasting glucose - ranges 90-105 but wants to avoid meds that may increase this. Has a strong family history of CAD. His brother died from CHF at age 46 although he weighed > 400 lbs. His father died from an MI in his 12s, and his paternal grandfather died from an MI in his 59s. He eats a heart healthy diet and stays active.  Current Medications: rosuvastatin 5mg  daily, CoQ10 100mg  daily Intolerances: atorvastatin 10mg  daily - cough, myalgias Risk Factors: angina, elevated calcium score consistent with CAD, fam hx of premature CAD LDL goal: 70mg /dL  Diet: Intermittent fasting to help glucose. No fast food or sweets. Cooks with olive oil.  Exercise: Stays active with walking and bicycling.  Family History: Father died in his 34s from MI, paternal grandfather died in his 58s from an MI, brother died in his 32s from CHF  Social History: Denies tobacco, alcohol, and illicit drug use.  Labs: 05/11/20: TC 203, TG 78, HDL 50, LDL 139, ALT 17, A1c 5.9% (rosuvastatin 5mg  daily)  Past Medical History:  Diagnosis Date  . Chest pain    negative stress echo in 2012  .  Hyperlipidemia   . Hypertension     Current Outpatient Medications on File Prior to Visit  Medication Sig Dispense Refill  . aspirin 81 MG tablet Take 81 mg by mouth daily.    . Cholecalciferol (VITAMIN D3) 125 MCG (5000 UT) CAPS See admin instructions.    . Coenzyme Q10 (CO Q-10) 100 MG CAPS See admin instructions.    56s FIBER PO Take 2 capsules by mouth daily.    07/11/20 loratadine (CLARITIN) 5 MG chewable tablet See admin instructions.    . rosuvastatin (CRESTOR) 5 MG tablet TAKE 1 TABLET BY MOUTH  DAILY AT 6 PM. 90 tablet 3  . valsartan (DIOVAN) 160 MG tablet TAKE 1 TABLET BY MOUTH  DAILY 60 tablet 0   No current facility-administered medications on file prior to visit.    Allergies  Allergen Reactions  . Sulfa Drugs Cross Reactors   . Lipitor [Atorvastatin]     intolerance    Assessment/Plan:  1. Hyperlipidemia - LDL 139 on rosuvastatin 5mg  daily, above goal < 70 given elevated coronary calcium score > 1000. Pt is intolerant to atorvastatin 10mg  daily and previously experienced muscle/joint pain on rosuvastatin before he started CoQ10 which has helped. Will increase rosuvastatin to 10mg  daily and have pt take 200mg  of CoQ10 daily. Will also submit prior authorization for PCSK9i therapy  as even high intensity statin if tolerated will not bring his LDL to goal - Repatha is preferred on formulary.  [name], PharmD, BCACP, CPP [facility] [address] [place], [place] [address] Phone: [phone]; Fax: [phone] 06/01/2020 4:11 PM  Addendum: Repatha prior authorization approved through 06/01/21. Rx sent to pharmacy along with copay card info. Pt aware. Scheduled f/u advanced lipid panel in 6 weeks, same day as appt with Dr Elease Hashimoto.

## 2020-06-01 NOTE — Addendum Note (Signed)
Addended by: Terrilyn Saver on: 06/01/2020 12:57 PM   Modules accepted: Orders

## 2020-06-01 NOTE — Patient Instructions (Addendum)
Your LDL is 139 and your goal is < 70  Increase your Crestor (rosuvastatin) from 5mg  to 10mg  daily Increase your CoQ10 from 100mg  to 200mg  daily - keep an eye out for any muscle or joint pain  I'll reach out to your insurance about either Repatha or Praluent injections. They are both given once every 2 weeks in the fatty tissue of your stomach. They lower your LDL cholesterol by 60% and help to prevent heart attacks and strokes  Your insurance may want you to try ezetimibe (Zetia) first - this is a daily tablet that lowers your LDL cholesterol by ~!0%

## 2020-06-02 MED ORDER — REPATHA SURECLICK 140 MG/ML ~~LOC~~ SOAJ
1.0000 | SUBCUTANEOUS | 11 refills | Status: DC
Start: 2020-06-02 — End: 2020-08-08

## 2020-06-22 ENCOUNTER — Telehealth: Payer: Self-pay | Admitting: Cardiovascular Disease

## 2020-06-22 NOTE — Telephone Encounter (Signed)
New Message:      Pt wants to know if there are any special instructions for his Ultrasound tomorrow(06-23-20)?

## 2020-06-22 NOTE — Telephone Encounter (Signed)
RN returned call to patient to review ultrasound information. Patient verbalized understanding.

## 2020-06-24 ENCOUNTER — Other Ambulatory Visit: Payer: Self-pay

## 2020-06-24 ENCOUNTER — Ambulatory Visit (HOSPITAL_COMMUNITY)
Admission: RE | Admit: 2020-06-24 | Discharge: 2020-06-24 | Disposition: A | Discharge status: Home / Self Care | Payer: 59 | Source: Ambulatory Visit | Attending: Cardiology | Admitting: Cardiology

## 2020-06-24 DIAGNOSIS — I2510 Atherosclerotic heart disease of native coronary artery without angina pectoris: Secondary | ICD-10-CM | POA: Diagnosis not present

## 2020-06-24 DIAGNOSIS — E785 Hyperlipidemia, unspecified: Secondary | ICD-10-CM | POA: Insufficient documentation

## 2020-06-24 DIAGNOSIS — Z8249 Family history of ischemic heart disease and other diseases of the circulatory system: Secondary | ICD-10-CM | POA: Insufficient documentation

## 2020-06-24 DIAGNOSIS — I10 Essential (primary) hypertension: Secondary | ICD-10-CM | POA: Insufficient documentation

## 2020-06-24 DIAGNOSIS — I6529 Occlusion and stenosis of unspecified carotid artery: Secondary | ICD-10-CM | POA: Diagnosis not present

## 2020-06-27 ENCOUNTER — Other Ambulatory Visit: Payer: Self-pay | Admitting: Pharmacist

## 2020-06-27 DIAGNOSIS — Z8249 Family history of ischemic heart disease and other diseases of the circulatory system: Secondary | ICD-10-CM

## 2020-06-27 MED ORDER — ICOSAPENT ETHYL 1 G PO CAPS: 2.0000 g | ORAL_CAPSULE | Freq: Two times a day (BID) | ORAL | 11 refills | Status: DC

## 2020-06-28 ENCOUNTER — Encounter (HOSPITAL_COMMUNITY): Payer: 59

## 2020-07-07 ENCOUNTER — Other Ambulatory Visit: Payer: Self-pay | Admitting: Cardiovascular Disease

## 2020-07-07 DIAGNOSIS — I10 Essential (primary) hypertension: Secondary | ICD-10-CM

## 2020-07-07 DIAGNOSIS — E785 Hyperlipidemia, unspecified: Secondary | ICD-10-CM

## 2020-07-08 ENCOUNTER — Other Ambulatory Visit: Payer: Self-pay | Admitting: Pharmacist

## 2020-07-08 MED ORDER — LOSARTAN POTASSIUM 50 MG PO TABS: 50.0000 mg | ORAL_TABLET | Freq: Every day | ORAL | 11 refills | Status: DC

## 2020-07-16 ENCOUNTER — Encounter: Payer: Self-pay | Admitting: Cardiovascular Disease

## 2020-07-16 NOTE — Progress Notes (Signed)
Cardiology Office Note:    Date:  07/19/2020   ID:  Bradley Dean, DOB July 27, 1955, MRN [id number]  PCP:  Daisy Floro, MD   [facility] Providers Cardiologist:  None     Referring MD: Daisy Floro, MD   Chief Complaint  Patient presents with   Hyperlipidemia    May 11, 2020   [name] is a 65 y.o. male with a hx of HTN, HLD I last saw Bradley Dean in 2012. He has been seeing [name], NP Family hx of CAD .  Had a remote cath in 2004  Works for [employer]. No CP or dyspnea  Does a fair amount of cycling without any difficulty  Has some orthostatic hypotension  Has had borderline hyperglycemia   BP has been well controlled.  . Has a + family hx of CAD Father died in his 14s of MI Brother died in his 36 s   Did not tolerate atorvastatin ( muscle aches)  Helped with Co-Q 10  Tolerates a low dose  rosuvastatin    July 19, 2020:  Bradley Dean is seen today for follow up of his HLD and coronary artery calcification  Seen with wife, [name] .  Coronary calcium score of 1646. This was 78 percentile for age-, race-, and sex-matched controls. We started ASA 81 mg a day  Lexiscan myoview showed no ischemia.  Normal LV function  He is here today to discuss   With exercise, ( biking) ,  gets PVCs when his HR is 130-140, will have PVCs  - several seconds of arrhythmia  Also when he is walking  Also has chest tightness , indigestion like sensation with exertion  No other GI issues    Past Medical History:  Diagnosis Date   Chest pain    negative stress echo in 2012   Hyperlipidemia    Hypertension     Past Surgical History:  Procedure Laterality Date   CARDIAC CATHETERIZATION     EF 65-&70% -- Normal left ventricular systolic function -- Smooth and normal coronary arteries. -- Vesta Mixer, M.D.    Current Medications: Current Meds  Medication Sig   Cholecalciferol (VITAMIN D3) 125 MCG (5000 UT) CAPS Take 5,000 Units by mouth every  other day. In the morning   Evolocumab (REPATHA SURECLICK) 140 MG/ML SOAJ Inject 1 pen into the skin every 14 (fourteen) days. (Patient taking differently: Inject 140 mg into the skin every 14 (fourteen) days. Thursdays)   icosapent Ethyl (VASCEPA) 1 g capsule Take 2 capsules (2 g total) by mouth 2 (two) times daily.   losartan (COZAAR) 50 MG tablet Take 1 tablet (50 mg total) by mouth daily. (Patient taking differently: Take 50 mg by mouth in the morning.)   propranolol (INDERAL) 10 MG tablet Take 1 tablet (10 mg total) by mouth 4 (four) times daily as needed. (Patient taking differently: Take 10 mg by mouth 4 (four) times daily as needed (PVCs (Premature ventricular contractions)).)   rosuvastatin (CRESTOR) 10 MG tablet Take 1 tablet by mouth daily (Patient taking differently: Take 10 mg by mouth at bedtime. Take 1 tablet by mouth daily)   [DISCONTINUED] aspirin 81 MG tablet Take 81 mg by mouth daily.   [DISCONTINUED] Coenzyme Q10 (CO Q-10) 100 MG CAPS See admin instructions.   [DISCONTINUED] loratadine (CLARITIN) 5 MG chewable tablet See admin instructions.   [DISCONTINUED] Menaquinone-7 (VITAMIN K2) 100 MCG CAPS    [DISCONTINUED] nitroGLYCERIN (NITROSTAT) 0.4 MG SL tablet Place  1 tablet (0.4 mg total) under the tongue every 5 (five) minutes as needed for chest pain.   [DISCONTINUED] Saw Palmetto-Zinc 160-15 MG CAPS Take 320 mg by mouth daily.     Allergies:   Sulfa drugs cross reactors and Lipitor [atorvastatin]   Social History   Socioeconomic History   Marital status: Married    Spouse name: Not on file   Number of children: Not on file   Years of education: Not on file   Highest education level: Not on file  Occupational History   Not on file  Tobacco Use   Smoking status: Never   Smokeless tobacco: Never  Vaping Use   Vaping Use: Never used  Substance and Sexual Activity   Alcohol use: No   Drug use: No   Sexual activity: Not on file  Other Topics Concern   Not on file   Social History Narrative   Not on file   Social Determinants of Health   Financial Resource Strain: Not on file  Food Insecurity: Not on file  Transportation Needs: Not on file  Physical Activity: Not on file  Stress: Not on file  Social Connections: Not on file     Family History: The patient's family history includes Heart disease in his father.  ROS:   Please see the history of present illness.     All other systems reviewed and are negative.  EKGs/Labs/Other Studies Reviewed:    The following studies were reviewed today:   EKG:   July 19, 2020: Sinus bradycardia 53.  No ST or T wave changes.  Recent Labs: 07/19/2020: ALT 15; BUN 21; Creatinine, Ser 0.88; Hemoglobin 15.2; Platelets 179; Potassium 4.7; Sodium 138  Recent Lipid Panel    Component Value Date/Time   CHOL 203 (H) 05/11/2020 1551   TRIG 78 05/11/2020 1551   HDL 50 05/11/2020 1551   CHOLHDL 4.1 05/11/2020 1551   CHOLHDL 3.7 06/28/2015 0857   VLDL 28 06/28/2015 0857   LDLCALC 139 (H) 05/11/2020 1551     Risk Assessment/Calculations:       Physical Exam:     Physical Exam: Blood pressure 128/78, pulse (!) 52, height 6' 1" (1.854 m), weight 190 lb 12.8 oz (86.5 kg), SpO2 99 %.  GEN:  Well nourished, well developed in no acute distress HEENT: Normal NECK: No JVD; No carotid bruits LYMPHATICS: No lymphadenopathy CARDIAC: RRR , no murmurs, rubs, gallops RESPIRATORY:  Clear to auscultation without rales, wheezing or rhonchi  ABDOMEN: Soft, I was able to palpate his abdominal aorta. MUSCULOSKELETAL:  No edema; No deformity  SKIN: Warm and dry NEUROLOGIC:  Alert and oriented x 3   ASSESSMENT:    1. Pre-procedure lab exam   2. Thoracic aortic aneurysm without rupture (HCC)   3. Murmur    PLAN:     Unstable angina: Brian presents with symptoms that are worrisome for unstable angina.  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  Recently has been having some shortness of breath and some  vague chest pressure with heart rate above 130-140.  He also is having increasing premature ventricular contraction or other palpitations at peak exercise.  He also does describe some indigestion-like symptoms that occur with exertion. Given his high coronary calcium score and very strong family history of premature coronary artery disease I think that we need to proceed with heart catheterization.  We discussed the risks, benefits, options regarding heart catheterization.  He understands and agrees to proceed.    2.  Hyperlipidemia:    Cont aggressive treatment for his lipids Cath   3.   Palpable abdominal aorta :  will get CTA of his aorta  4.  Mild asc. Aortic dilatation .   Will get CTA of his entire aorta   5.  Systolic murmur:   will get an echo for LV function and valvular function .      Medication Adjustments/Labs and Tests Ordered: Current medicines are reviewed at length with the patient today.  Concerns regarding medicines are outlined above.  Orders Placed This Encounter  Procedures   CT ANGIO CHEST AORTA W/CM & OR WO/CM   CT ANGIO ABDOMEN W &/OR WO CONTRAST   CBC   Basic metabolic panel   EKG 12-Lead   ECHOCARDIOGRAM COMPLETE    Meds ordered this encounter  Medications   DISCONTD: nitroGLYCERIN (NITROSTAT) 0.4 MG SL tablet    Sig: Place 1 tablet (0.4 mg total) under the tongue every 5 (five) minutes as needed for chest pain.    Dispense:  25 tablet    Refill:  3   nitroGLYCERIN (NITROSTAT) 0.4 MG SL tablet    Sig: Place 1 tablet (0.4 mg total) under the tongue every 5 (five) minutes as needed for chest pain.    Dispense:  25 tablet    Refill:  3     Patient Instructions  Medication Instructions:  Your physician has recommended you make the following change in your medication:  1.) nitroglycerin 0.4 mg sublingual tablets - place one tab under tongue for chest pain.  May repeat two times, 5 min apart for a total of 3 doses.  Please call [phone] if you need to use dose  #3  *If you need a refill on your cardiac medications before your next appointment, please call your pharmacy*   Lab Work: Today: BMET, CBC and lipid work up ordered by Lipid Clinic  Testing/Procedures: CT-scan of the chest CT-scan of the abdomen  Non-Cardiac CT Angiography (CTA), is a special type of CT scan that uses a computer to produce multi-dimensional views of major blood vessels throughout the body. In CT angiography, a contrast material is injected through an IV to help visualize the blood vessels    Follow-Up: At [facility], you and your health needs are our priority.  As part of our continuing mission to provide you with exceptional heart care, we have created designated Provider Care Teams.  These Care Teams include your primary Cardiologist (physician) and Advanced Practice Providers (APPs -  Physician Assistants and Nurse Practitioners) who all work together to provide you with the care you need, when you need it.  We recommend signing up for the patient portal called "MyChart".  Sign up information is provided on this After Visit Summary.  MyChart is used to connect with patients for Virtual Visits (Telemedicine).  Patients are able to view lab/test results, encounter notes, upcoming appointments, etc.  Non-urgent messages can be sent to your provider as well.   To learn more about what you can do with MyChart, go to [email or web address].    Your next appointment:   3 month(s)  The format for your next appointment:   In Person  Provider:   You may see [name], MD or one of the following Advanced Practice Providers on your designated Care Team:   [name], PA-C Vin Bhagat, [place]   Other Instructions  [facility] OFF[facility] [address],  [address] [place] [address] Dept: [phone] Loc: [phone]  AULTON ROUTT  07/19/2020  You are scheduled for a Cardiac  Catheterization on Friday, July 22 with Dr. Verdis Prime.  1. Please arrive at the [facility] (Main Entrance A) at [facility]: [address] [place], [place] [address] at 5:30 AM (This time is two hours before your procedure to ensure your preparation). Free valet parking service is available.   Special note: Every effort is made to have your procedure done on time. Please understand that emergencies sometimes delay scheduled procedures.  2. Diet: Do not eat solid foods after midnight.  The patient may have clear liquids until 5am upon the day of the procedure.  3. Labs: You will need to have blood drawn on Tuesday, July 19 at [facility] at [facility]. [address], [place]  Open: 7:30am - 5pm    Phone: [phone]. You do not need to be fasting.  4. Medication instructions in preparation for your procedure:   Contrast Allergy: No  On the morning of your procedure, take your Aspirin and any morning medicines NOT listed above.  You may use sips of water.  5. Plan for one night stay--bring personal belongings. 6. Bring a current list of your medications and current insurance cards. 7. You MUST have a responsible person to drive you home. 8. Someone MUST be with you the first 24 hours after you arrive home or your discharge will be delayed. 9. Please wear clothes that are easy to get on and off and wear slip-on shoes.  Thank you for allowing [place] to care for you!   -- [facility] Invasive Cardiovascular services     Signed, [name], MD  07/19/2020 8:31 PM    [facility]

## 2020-07-16 NOTE — H&P (View-Only) (Signed)
Cardiology Office Note:    Date:  07/19/2020   ID:  Bradley Dean, DOB July 27, 1955, MRN [id number]  PCP:  Daisy Floro, MD   [facility] Providers Cardiologist:  None     Referring MD: Daisy Floro, MD   Chief Complaint  Patient presents with   Hyperlipidemia    May 11, 2020   [name] is a 65 y.o. male with a hx of HTN, HLD I last saw Bradley Dean in 2012. He has been seeing [name], NP Family hx of CAD .  Had a remote cath in 2004  Works for [employer]. No CP or dyspnea  Does a fair amount of cycling without any difficulty  Has some orthostatic hypotension  Has had borderline hyperglycemia   BP has been well controlled.  . Has a + family hx of CAD Father died in his 14s of MI Brother died in his 36 s   Did not tolerate atorvastatin ( muscle aches)  Helped with Co-Q 10  Tolerates a low dose  rosuvastatin    July 19, 2020:  Bradley Dean is seen today for follow up of his HLD and coronary artery calcification  Seen with wife, Bradley Dean .  Coronary calcium score of 1646. This was 78 percentile for age-, race-, and sex-matched controls. We started ASA 81 mg a day  Lexiscan myoview showed no ischemia.  Normal LV function  He is here today to discuss   With exercise, ( biking) ,  gets PVCs when his HR is 130-140, will have PVCs  - several seconds of arrhythmia  Also when he is walking  Also has chest tightness , indigestion like sensation with exertion  No other GI issues    Past Medical History:  Diagnosis Date   Chest pain    negative stress echo in 2012   Hyperlipidemia    Hypertension     Past Surgical History:  Procedure Laterality Date   CARDIAC CATHETERIZATION     EF 65-&70% -- Normal left ventricular systolic function -- Smooth and normal coronary arteries. -- Vesta Mixer, M.D.    Current Medications: Current Meds  Medication Sig   Cholecalciferol (VITAMIN D3) 125 MCG (5000 UT) CAPS Take 5,000 Units by mouth every  other day. In the morning   Evolocumab (REPATHA SURECLICK) 140 MG/ML SOAJ Inject 1 pen into the skin every 14 (fourteen) days. (Patient taking differently: Inject 140 mg into the skin every 14 (fourteen) days. Thursdays)   icosapent Ethyl (VASCEPA) 1 g capsule Take 2 capsules (2 g total) by mouth 2 (two) times daily.   losartan (COZAAR) 50 MG tablet Take 1 tablet (50 mg total) by mouth daily. (Patient taking differently: Take 50 mg by mouth in the morning.)   propranolol (INDERAL) 10 MG tablet Take 1 tablet (10 mg total) by mouth 4 (four) times daily as needed. (Patient taking differently: Take 10 mg by mouth 4 (four) times daily as needed (PVCs (Premature ventricular contractions)).)   rosuvastatin (CRESTOR) 10 MG tablet Take 1 tablet by mouth daily (Patient taking differently: Take 10 mg by mouth at bedtime. Take 1 tablet by mouth daily)   [DISCONTINUED] aspirin 81 MG tablet Take 81 mg by mouth daily.   [DISCONTINUED] Coenzyme Q10 (CO Q-10) 100 MG CAPS See admin instructions.   [DISCONTINUED] loratadine (CLARITIN) 5 MG chewable tablet See admin instructions.   [DISCONTINUED] Menaquinone-7 (VITAMIN K2) 100 MCG CAPS    [DISCONTINUED] nitroGLYCERIN (NITROSTAT) 0.4 MG SL tablet Place  1 tablet (0.4 mg total) under the tongue every 5 (five) minutes as needed for chest pain.   [DISCONTINUED] Saw Palmetto-Zinc 160-15 MG CAPS Take 320 mg by mouth daily.     Allergies:   Sulfa drugs cross reactors and Lipitor [atorvastatin]   Social History   Socioeconomic History   Marital status: Married    Spouse name: Not on file   Number of children: Not on file   Years of education: Not on file   Highest education level: Not on file  Occupational History   Not on file  Tobacco Use   Smoking status: Never   Smokeless tobacco: Never  Vaping Use   Vaping Use: Never used  Substance and Sexual Activity   Alcohol use: No   Drug use: No   Sexual activity: Not on file  Other Topics Concern   Not on file   Social History Narrative   Not on file   Social Determinants of Health   Financial Resource Strain: Not on file  Food Insecurity: Not on file  Transportation Needs: Not on file  Physical Activity: Not on file  Stress: Not on file  Social Connections: Not on file     Family History: The patient's family history includes Heart disease in his father.  ROS:   Please see the history of present illness.     All other systems reviewed and are negative.  EKGs/Labs/Other Studies Reviewed:    The following studies were reviewed today:   EKG:   July 19, 2020: Sinus bradycardia 53.  No ST or T wave changes.  Recent Labs: 07/19/2020: ALT 15; BUN 21; Creatinine, Ser 0.88; Hemoglobin 15.2; Platelets 179; Potassium 4.7; Sodium 138  Recent Lipid Panel    Component Value Date/Time   CHOL 203 (H) 05/11/2020 1551   TRIG 78 05/11/2020 1551   HDL 50 05/11/2020 1551   CHOLHDL 4.1 05/11/2020 1551   CHOLHDL 3.7 06/28/2015 0857   VLDL 28 06/28/2015 0857   LDLCALC 139 (H) 05/11/2020 1551     Risk Assessment/Calculations:       Physical Exam:     Physical Exam: Blood pressure 128/78, pulse (!) 52, height 6\' 1"  (1.854 m), weight 190 lb 12.8 oz (86.5 kg), SpO2 99 %.  GEN:  Well nourished, well developed in no acute distress HEENT: Normal NECK: No JVD; No carotid bruits LYMPHATICS: No lymphadenopathy CARDIAC: RRR , no murmurs, rubs, gallops RESPIRATORY:  Clear to auscultation without rales, wheezing or rhonchi  ABDOMEN: Soft, I was able to palpate his abdominal aorta. MUSCULOSKELETAL:  No edema; No deformity  SKIN: Warm and dry NEUROLOGIC:  Alert and oriented x 3   ASSESSMENT:    1. Pre-procedure lab exam   2. Thoracic aortic aneurysm without rupture (HCC)   3. Murmur    PLAN:     Unstable angina: Arlys JohnBrian presents with symptoms that are worrisome for unstable angina.  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  Recently has been having some shortness of breath and some  vague chest pressure with heart rate above 130-140.  He also is having increasing premature ventricular contraction or other palpitations at peak exercise.  He also does describe some indigestion-like symptoms that occur with exertion. Given his high coronary calcium score and very strong family history of premature coronary artery disease I think that we need to proceed with heart catheterization.  We discussed the risks, benefits, options regarding heart catheterization.  He understands and agrees to proceed.  2.  Hyperlipidemia:    Cont aggressive treatment for his lipids Cath   3.   Palpable abdominal aorta :  will get CTA of his aorta  4.  Mild asc. Aortic dilatation .   Will get CTA of his entire aorta   5.  Systolic murmur:   will get an echo for LV function and valvular function .      Medication Adjustments/Labs and Tests Ordered: Current medicines are reviewed at length with the patient today.  Concerns regarding medicines are outlined above.  Orders Placed This Encounter  Procedures   CT ANGIO CHEST AORTA W/CM & OR WO/CM   CT ANGIO ABDOMEN W &/OR WO CONTRAST   CBC   Basic metabolic panel   EKG 12-Lead   ECHOCARDIOGRAM COMPLETE    Meds ordered this encounter  Medications   DISCONTD: nitroGLYCERIN (NITROSTAT) 0.4 MG SL tablet    Sig: Place 1 tablet (0.4 mg total) under the tongue every 5 (five) minutes as needed for chest pain.    Dispense:  25 tablet    Refill:  3   nitroGLYCERIN (NITROSTAT) 0.4 MG SL tablet    Sig: Place 1 tablet (0.4 mg total) under the tongue every 5 (five) minutes as needed for chest pain.    Dispense:  25 tablet    Refill:  3     Patient Instructions  Medication Instructions:  Your physician has recommended you make the following change in your medication:  1.) nitroglycerin 0.4 mg sublingual tablets - place one tab under tongue for chest pain.  May repeat two times, 5 min apart for a total of 3 doses.  Please call [phone] if you need to use dose  #3  *If you need a refill on your cardiac medications before your next appointment, please call your pharmacy*   Lab Work: Today: BMET, CBC and lipid work up ordered by Lipid Clinic  Testing/Procedures: CT-scan of the chest CT-scan of the abdomen  Non-Cardiac CT Angiography (CTA), is a special type of CT scan that uses a computer to produce multi-dimensional views of major blood vessels throughout the body. In CT angiography, a contrast material is injected through an IV to help visualize the blood vessels    Follow-Up: At [facility], you and your health needs are our priority.  As part of our continuing mission to provide you with exceptional heart care, we have created designated Provider Care Teams.  These Care Teams include your primary Cardiologist (physician) and Advanced Practice Providers (APPs -  Physician Assistants and Nurse Practitioners) who all work together to provide you with the care you need, when you need it.  We recommend signing up for the patient portal called "MyChart".  Sign up information is provided on this After Visit Summary.  MyChart is used to connect with patients for Virtual Visits (Telemedicine).  Patients are able to view lab/test results, encounter notes, upcoming appointments, etc.  Non-urgent messages can be sent to your provider as well.   To learn more about what you can do with MyChart, go to [email or web address].    Your next appointment:   3 month(s)  The format for your next appointment:   In Person  Provider:   You may see [name], MD or one of the following Advanced Practice Providers on your designated Care Team:   [name], PA-C Vin Bhagat, [place]   Other Instructions  [facility] OFF[facility] [address],  [address] [place] [address] Dept: [phone] Loc: [phone]  AULTON ROUTT  07/19/2020  You are scheduled for a Cardiac  Catheterization on Friday, July 22 with Dr. Verdis Prime.  1. Please arrive at the [facility] (Main Entrance A) at [facility]: [address] [place], [place] [address] at 5:30 AM (This time is two hours before your procedure to ensure your preparation). Free valet parking service is available.   Special note: Every effort is made to have your procedure done on time. Please understand that emergencies sometimes delay scheduled procedures.  2. Diet: Do not eat solid foods after midnight.  The patient may have clear liquids until 5am upon the day of the procedure.  3. Labs: You will need to have blood drawn on Tuesday, July 19 at [facility] at [facility]. [address], [place]  Open: 7:30am - 5pm    Phone: [phone]. You do not need to be fasting.  4. Medication instructions in preparation for your procedure:   Contrast Allergy: No  On the morning of your procedure, take your Aspirin and any morning medicines NOT listed above.  You may use sips of water.  5. Plan for one night stay--bring personal belongings. 6. Bring a current list of your medications and current insurance cards. 7. You MUST have a responsible person to drive you home. 8. Someone MUST be with you the first 24 hours after you arrive home or your discharge will be delayed. 9. Please wear clothes that are easy to get on and off and wear slip-on shoes.  Thank you for allowing [place] to care for you!   -- [facility] Invasive Cardiovascular services     Signed, [name], MD  07/19/2020 8:31 PM    [facility]

## 2020-07-19 ENCOUNTER — Other Ambulatory Visit: Payer: 59 | Admitting: *Deleted

## 2020-07-19 ENCOUNTER — Ambulatory Visit (INDEPENDENT_AMBULATORY_CARE_PROVIDER_SITE_OTHER): Payer: 59 | Admitting: Cardiovascular Disease

## 2020-07-19 ENCOUNTER — Other Ambulatory Visit: Payer: Self-pay

## 2020-07-19 ENCOUNTER — Encounter: Payer: Self-pay | Admitting: Cardiovascular Disease

## 2020-07-19 VITALS — BP 128/78 | HR 52 | Ht 73.0 in | Wt 190.8 lb

## 2020-07-19 DIAGNOSIS — R011 Cardiac murmur, unspecified: Secondary | ICD-10-CM

## 2020-07-19 DIAGNOSIS — Z01812 Encounter for preprocedural laboratory examination: Secondary | ICD-10-CM

## 2020-07-19 DIAGNOSIS — I712 Thoracic aortic aneurysm, without rupture, unspecified: Secondary | ICD-10-CM

## 2020-07-19 DIAGNOSIS — E782 Mixed hyperlipidemia: Secondary | ICD-10-CM

## 2020-07-19 DIAGNOSIS — I25709 Atherosclerosis of coronary artery bypass graft(s), unspecified, with unspecified angina pectoris: Secondary | ICD-10-CM

## 2020-07-19 DIAGNOSIS — Z8249 Family history of ischemic heart disease and other diseases of the circulatory system: Secondary | ICD-10-CM

## 2020-07-19 LAB — BASIC METABOLIC PANEL
BUN/Creatinine Ratio: 24 (ref 10–24)
BUN: 21 mg/dL (ref 8–27)
CO2: 30 mmol/L — ABNORMAL HIGH (ref 20–29)
Calcium: 10 mg/dL (ref 8.6–10.2)
Chloride: 100 mmol/L (ref 96–106)
Creatinine, Ser: 0.88 mg/dL (ref 0.76–1.27)
Glucose: 118 mg/dL — ABNORMAL HIGH (ref 65–99)
Potassium: 4.7 mmol/L (ref 3.5–5.2)
Sodium: 138 mmol/L (ref 134–144)
eGFR: 96 mL/min/{1.73_m2} (ref >59)

## 2020-07-19 LAB — CBC
Hematocrit: 44.9 % (ref 37.5–51.0)
Hemoglobin: 15.2 g/dL (ref 13.0–17.7)
MCH: 31.1 pg (ref 26.6–33.0)
MCHC: 33.9 g/dL (ref 31.5–35.7)
MCV: 92 fL (ref 79–97)
Platelets: 179 10*3/uL (ref 150–450)
RBC: 4.89 x10E6/uL (ref 4.14–5.80)
RDW: 14.9 % (ref 11.6–15.4)
WBC: 7 10*3/uL (ref 3.4–10.8)

## 2020-07-19 MED ORDER — NITROGLYCERIN 0.4 MG SL SUBL: 0.4000 mg | SUBLINGUAL_TABLET | SUBLINGUAL | 3 refills | Status: DC | PRN

## 2020-07-19 NOTE — Patient Instructions (Addendum)
Medication Instructions:  Your physician has recommended you make the following change in your medication:  1.) nitroglycerin 0.4 mg sublingual tablets - place one tab under tongue for chest pain.  May repeat two times, 5 min apart for a total of 3 doses.  Please call [phone] if you need to use dose #3  *If you need a refill on your cardiac medications before your next appointment, please call your pharmacy*   Lab Work: Today: BMET, CBC and lipid work up ordered by [facility]  Testing/Procedures: CT-scan of the chest CT-scan of the abdomen  Non-Cardiac CT Angiography (CTA), is a special type of CT scan that uses a computer to produce multi-dimensional views of major blood vessels throughout the body. In CT angiography, a contrast material is injected through an IV to help visualize the blood vessels    Follow-Up: At [facility], you and your health needs are our priority.  As part of our continuing mission to provide you with exceptional heart care, we have created designated Provider Care Teams.  These Care Teams include your primary Cardiologist (physician) and Advanced Practice Providers (APPs -  Physician Assistants and Nurse Practitioners) who all work together to provide you with the care you need, when you need it.  We recommend signing up for the patient portal called "MyChart".  Sign up information is provided on this After Visit Summary.  MyChart is used to connect with patients for Virtual Visits (Telemedicine).  Patients are able to view lab/test results, encounter notes, upcoming appointments, etc.  Non-urgent messages can be sent to your provider as well.   To learn more about what you can do with MyChart, go to [email or web address].    Your next appointment:   3 month(s)  The format for your next appointment:   In Person  Provider:   You may see Bradley Miss, MD or one of the following Advanced Practice Providers on your designated Care Team:   [name],  PA-C [name], [place]   Other Instructions  [name] CARDIOVASCULAR DIVISION [name] ST OFFICE [address] [place] [address] Dept: [phone] Loc: [phone]  Bradley Dean  07/19/2020  You are scheduled for a Cardiac Catheterization on Friday, July 22 with Dr. Verdis Prime.  1. Please arrive at the [facility] (Main Entrance A) at [facility]: [address] [place], [place] [address] at 5:30 AM (This time is two hours before your procedure to ensure your preparation). Free valet parking service is available.   Special note: Every effort is made to have your procedure done on time. Please understand that emergencies sometimes delay scheduled procedures.  2. Diet: Do not eat solid foods after midnight.  The patient may have clear liquids until 5am upon the day of the procedure.  3. Labs: You will need to have blood drawn on Tuesday, July 19 at [facility] at [facility]. [address], [place]  Open: 7:30am - 5pm    Phone: [phone]. You do not need to be fasting.  4. Medication instructions in preparation for your procedure:   Contrast Allergy: No  On the morning of your procedure, take your Aspirin and any morning medicines NOT listed above.  You may use sips of water.  5. Plan for one night stay--bring personal belongings. 6. Bring a current list of your medications and current insurance cards. 7. You MUST have a responsible person to drive you home. 8.  Someone MUST be with you the first 24 hours after you arrive home or your discharge will be delayed. 9. Please wear clothes that are easy to get on and off and wear slip-on shoes.  Thank you for allowing [place] to care for you!   -- [place] [facility]

## 2020-07-20 ENCOUNTER — Other Ambulatory Visit: Payer: Self-pay | Admitting: Cardiovascular Disease

## 2020-07-20 DIAGNOSIS — I200 Unstable angina: Secondary | ICD-10-CM

## 2020-07-21 ENCOUNTER — Telehealth: Payer: Self-pay | Admitting: *Deleted

## 2020-07-21 NOTE — Telephone Encounter (Signed)
Pt contacted pre-catheterization scheduled at [facility] for: Friday July 22, 2020 7:30 AM Verified arrival time and place: [facility] Main Entrance A [facility]) at:5:30 AM   No solid food after midnight prior to cath, clear liquids until 5 AM day of procedure.   AM meds can be  taken pre-cath with sips of water including: aspirin 81 mg   Confirmed patient has responsible adult to drive home post procedure and be with patient first 24 hours after arriving home:  You are allowed ONE visitor in the waiting room during the time you are at the hospital for your procedure. Both you and your visitor must wear a mask once you enter the hospital.   Patient reports does not currently have any symptoms concerning for COVID-19 and no household members with COVID-19 like illness.      Reviewed procedure/mask/visitor instructions with patient.

## 2020-07-22 ENCOUNTER — Other Ambulatory Visit: Payer: Self-pay

## 2020-07-22 ENCOUNTER — Ambulatory Visit (HOSPITAL_COMMUNITY)
Admission: RE | Admit: 2020-07-22 | Discharge: 2020-07-22 | Disposition: A | Discharge status: Home / Self Care | Payer: 59 | Attending: Interventional Cardiology | Admitting: Interventional Cardiology

## 2020-07-22 ENCOUNTER — Encounter (HOSPITAL_COMMUNITY): Payer: Self-pay | Admitting: Interventional Cardiology

## 2020-07-22 ENCOUNTER — Encounter (HOSPITAL_COMMUNITY)
Admission: RE | Disposition: A | Discharge status: Home / Self Care | Payer: Self-pay | Source: Home / Self Care | Attending: Interventional Cardiology

## 2020-07-22 DIAGNOSIS — I712 Thoracic aortic aneurysm, without rupture: Secondary | ICD-10-CM | POA: Insufficient documentation

## 2020-07-22 DIAGNOSIS — I10 Essential (primary) hypertension: Secondary | ICD-10-CM | POA: Diagnosis present

## 2020-07-22 DIAGNOSIS — Z882 Allergy status to sulfonamides status: Secondary | ICD-10-CM | POA: Diagnosis not present

## 2020-07-22 DIAGNOSIS — E785 Hyperlipidemia, unspecified: Secondary | ICD-10-CM | POA: Diagnosis present

## 2020-07-22 DIAGNOSIS — R011 Cardiac murmur, unspecified: Secondary | ICD-10-CM | POA: Diagnosis not present

## 2020-07-22 DIAGNOSIS — I25709 Atherosclerosis of coronary artery bypass graft(s), unspecified, with unspecified angina pectoris: Secondary | ICD-10-CM | POA: Diagnosis present

## 2020-07-22 DIAGNOSIS — Z8249 Family history of ischemic heart disease and other diseases of the circulatory system: Secondary | ICD-10-CM | POA: Diagnosis not present

## 2020-07-22 DIAGNOSIS — I25110 Atherosclerotic heart disease of native coronary artery with unstable angina pectoris: Secondary | ICD-10-CM | POA: Diagnosis not present

## 2020-07-22 DIAGNOSIS — I2510 Atherosclerotic heart disease of native coronary artery without angina pectoris: Secondary | ICD-10-CM

## 2020-07-22 DIAGNOSIS — R079 Chest pain, unspecified: Secondary | ICD-10-CM | POA: Diagnosis present

## 2020-07-22 DIAGNOSIS — Z79899 Other long term (current) drug therapy: Secondary | ICD-10-CM | POA: Insufficient documentation

## 2020-07-22 DIAGNOSIS — Z888 Allergy status to other drugs, medicaments and biological substances status: Secondary | ICD-10-CM | POA: Diagnosis not present

## 2020-07-22 DIAGNOSIS — I200 Unstable angina: Secondary | ICD-10-CM

## 2020-07-22 HISTORY — PX: LEFT HEART CATH AND CORONARY ANGIOGRAPHY: CATH118249

## 2020-07-22 SURGERY — LEFT HEART CATH AND CORONARY ANGIOGRAPHY
Anesthesia: LOCAL

## 2020-07-22 MED ORDER — SODIUM CHLORIDE 0.9 % IV SOLN: 250.0000 mL | INTRAVENOUS | Status: DC | PRN

## 2020-07-22 MED ORDER — HEPARIN (PORCINE) IN NACL 1000-0.9 UT/500ML-% IV SOLN
INTRAVENOUS | Status: DC | PRN
  Administered 2020-07-22 (×4): 500 mL

## 2020-07-22 MED ORDER — SODIUM CHLORIDE 0.9 % WEIGHT BASED INFUSION: 1.0000 mL/kg/h | INTRAVENOUS | Status: DC

## 2020-07-22 MED ORDER — ACETAMINOPHEN 325 MG PO TABS: 650.0000 mg | ORAL_TABLET | ORAL | Status: DC | PRN

## 2020-07-22 MED ORDER — LABETALOL HCL 5 MG/ML IV SOLN: 10.0000 mg | INTRAVENOUS | Status: DC | PRN

## 2020-07-22 MED ORDER — HEPARIN SODIUM (PORCINE) 1000 UNIT/ML IJ SOLN
INTRAMUSCULAR | Status: DC | PRN
  Administered 2020-07-22: 4000 [IU] via INTRAVENOUS

## 2020-07-22 MED ORDER — FENTANYL CITRATE (PF) 100 MCG/2ML IJ SOLN
INTRAMUSCULAR | Status: AC
  Filled 2020-07-22: qty 2

## 2020-07-22 MED ORDER — ASPIRIN 81 MG PO CHEW: 81.0000 mg | CHEWABLE_TABLET | Freq: Every day | ORAL | Status: DC

## 2020-07-22 MED ORDER — LIDOCAINE HCL (PF) 1 % IJ SOLN
INTRAMUSCULAR | Status: DC | PRN
  Administered 2020-07-22: 2 mL via INTRADERMAL

## 2020-07-22 MED ORDER — VERAPAMIL HCL 2.5 MG/ML IV SOLN
INTRAVENOUS | Status: AC
  Filled 2020-07-22: qty 2

## 2020-07-22 MED ORDER — VERAPAMIL HCL 2.5 MG/ML IV SOLN
INTRAVENOUS | Status: DC | PRN
  Administered 2020-07-22: 10 mL via INTRA_ARTERIAL

## 2020-07-22 MED ORDER — SODIUM CHLORIDE 0.9% FLUSH: 3.0000 mL | INTRAVENOUS | Status: DC | PRN

## 2020-07-22 MED ORDER — MIDAZOLAM HCL 2 MG/2ML IJ SOLN
INTRAMUSCULAR | Status: DC | PRN
  Administered 2020-07-22: 1 mg via INTRAVENOUS

## 2020-07-22 MED ORDER — LIDOCAINE HCL (PF) 1 % IJ SOLN
INTRAMUSCULAR | Status: AC
  Filled 2020-07-22: qty 30

## 2020-07-22 MED ORDER — FENTANYL CITRATE (PF) 100 MCG/2ML IJ SOLN
INTRAMUSCULAR | Status: DC | PRN
  Administered 2020-07-22: 50 ug via INTRAVENOUS

## 2020-07-22 MED ORDER — MIDAZOLAM HCL 2 MG/2ML IJ SOLN
INTRAMUSCULAR | Status: AC
  Filled 2020-07-22: qty 2

## 2020-07-22 MED ORDER — SODIUM CHLORIDE 0.9% FLUSH: 3.0000 mL | Freq: Two times a day (BID) | INTRAVENOUS | Status: DC

## 2020-07-22 MED ORDER — HEPARIN SODIUM (PORCINE) 1000 UNIT/ML IJ SOLN
INTRAMUSCULAR | Status: AC
  Filled 2020-07-22: qty 1

## 2020-07-22 MED ORDER — HEPARIN (PORCINE) IN NACL 1000-0.9 UT/500ML-% IV SOLN
INTRAVENOUS | Status: AC
  Filled 2020-07-22: qty 500

## 2020-07-22 MED ORDER — ASPIRIN 81 MG PO CHEW: 81.0000 mg | CHEWABLE_TABLET | ORAL | Status: DC

## 2020-07-22 MED ORDER — IOHEXOL 350 MG/ML SOLN
INTRAVENOUS | Status: DC | PRN
  Administered 2020-07-22: 70 mL via INTRA_ARTERIAL

## 2020-07-22 MED ORDER — SODIUM CHLORIDE 0.9 % WEIGHT BASED INFUSION: 3.0000 mL/kg/h | INTRAVENOUS | Status: AC

## 2020-07-22 MED ORDER — HYDRALAZINE HCL 20 MG/ML IJ SOLN: 10.0000 mg | INTRAMUSCULAR | Status: DC | PRN

## 2020-07-22 MED ORDER — ONDANSETRON HCL 4 MG/2ML IJ SOLN: 4.0000 mg | Freq: Four times a day (QID) | INTRAMUSCULAR | Status: DC | PRN

## 2020-07-22 MED ORDER — SODIUM CHLORIDE 0.9 % IV SOLN: INTRAVENOUS | Status: DC

## 2020-07-22 SURGICAL SUPPLY — 9 items

## 2020-07-22 NOTE — Interval H&P Note (Signed)
Cath Lab Visit (complete for each Cath Lab visit)  Clinical Evaluation Leading to the Procedure:   ACS: No.  Non-ACS:    Anginal Classification: CCS II  Anti-ischemic medical therapy: Minimal Therapy (1 class of medications)  Non-Invasive Test Results: Low-risk stress test findings: cardiac mortality <1%/year  Prior CABG: No previous CABG      History and Physical Interval Note:  07/22/2020 7:31 AM  Bradley Dean  has presented today for surgery, with the diagnosis of chest pain.  The various methods of treatment have been discussed with the patient and family. After consideration of risks, benefits and other options for treatment, the patient has consented to  Procedure(s): LEFT HEART CATH AND CORONARY ANGIOGRAPHY (N/A) as a surgical intervention.  The patient's history has been reviewed, patient examined, no change in status, stable for surgery.  I have reviewed the patient's chart and labs.  Questions were answered to the patient's satisfaction.     Lyn Records III

## 2020-07-22 NOTE — CV Procedure (Signed)
Heavy LAD calcification with 50 to 70% regions of narrowing from proximal to mid vessel.  No critical obstructive disease noted.  Ostial to proximal second diagonal 75%. Left main is widely patent Circumflex is widely patent RCA arises anteriorly and contains segmental 30 to 40% mid vessel narrowing. LV EF 55%.  EDP 14 mmHg.   Aggressive risk factor modification with LDL less than 55, blood pressure control 130/80 mmHg or less.  Consider adding beta-blocker therapy to suppress heart rate and PVCs.

## 2020-07-26 DIAGNOSIS — I10 Essential (primary) hypertension: Secondary | ICD-10-CM

## 2020-07-26 DIAGNOSIS — E782 Mixed hyperlipidemia: Secondary | ICD-10-CM

## 2020-07-28 LAB — OMEGACHECK(TM) (EPA+DPA+DHA)
Arachidonic Acid/EPA Ratio: 7.4 (ref 3.7–40.7)
Arachidonic Acid: 14.1 % by wt (ref 8.6–15.6)
DHA: 3.2 % by wt (ref 1.4–5.1)
DPA: 1.7 % by wt (ref 0.8–1.8)
EPA: 1.9 % by wt (ref 0.2–2.3)
Linoleic Acid: 23.2 % by wt (ref 18.6–29.5)
Omega-3 total: 6.8 % by wt
Omega-6 total: 39.8 % by wt
Omega-6/Omega-3 Ratio: 5.9 (ref 3.7–14.4)
OmegaCheck(TM): 6.8 % by wt (ref >5.4)

## 2020-07-28 LAB — HEPATIC FUNCTION PANEL
ALT: 15 IU/L (ref 0–44)
AST: 16 IU/L (ref 0–40)
Albumin: 4.6 g/dL (ref 3.8–4.8)
Alkaline Phosphatase: 84 IU/L (ref 44–121)
Bilirubin Total: 0.5 mg/dL (ref 0.0–1.2)
Bilirubin, Direct: 0.18 mg/dL (ref 0.00–0.40)
Total Protein: 6.9 g/dL (ref 6.0–8.5)

## 2020-07-28 LAB — NMR, LIPOPROFILE
Cholesterol, Total: 116 mg/dL (ref 100–199)
HDL Particle Number: 27.1 umol/L — ABNORMAL LOW (ref >=30.5)
HDL-C: 55 mg/dL (ref >39)
LDL Particle Number: 685 nmol/L (ref <1000)
LDL Size: 20.5 nm — ABNORMAL LOW (ref >20.5)
LDL-C (NIH Calc): 48 mg/dL (ref 0–99)
LP-IR Score: <25 (ref <=45)
Small LDL Particle Number: 241 nmol/L (ref <=527)
Triglycerides: 60 mg/dL (ref 0–149)

## 2020-07-28 LAB — APOLIPOPROTEIN B: Apolipoprotein B: 54 mg/dL (ref <90)

## 2020-07-28 LAB — LIPOPROTEIN A (LPA): Lipoprotein (a): 99.6 nmol/L — ABNORMAL HIGH (ref <75.0)

## 2020-07-28 NOTE — Addendum Note (Signed)
Addended by: Misha Antonini E on: 07/28/2020 12:07 PM   Modules accepted: Orders

## 2020-08-02 ENCOUNTER — Telehealth: Payer: Self-pay

## 2020-08-02 NOTE — Telephone Encounter (Signed)
Called and spoke w/pt and stated that Bradley Dean is out of office but Malena Peer indicated that it was ok to place him on praluent 150mg  via . The pt stated that they would prefer to wait for megan supple's judgement on the dosage since they had been working with her and they also stated that they have another shot of repatha left so its not a hurry situation. Will route this to pharmd pool so all pharmds are aware. I also provided the copay card information to the pt since the pt requested that I do not send the rx sent until its reviewed by megan supple. Praluent copay card information is as follows: Id: [id number]  Bin: [id number]  Pcn: [id number]  [id number]

## 2020-08-03 MED ORDER — LOSARTAN POTASSIUM 50 MG PO TABS: 50.0000 mg | ORAL_TABLET | Freq: Every day | ORAL | 1 refills | Status: DC

## 2020-08-05 ENCOUNTER — Ambulatory Visit (HOSPITAL_COMMUNITY): Payer: 59 | Attending: Cardiovascular Disease

## 2020-08-05 ENCOUNTER — Other Ambulatory Visit: Payer: Self-pay

## 2020-08-05 DIAGNOSIS — I712 Thoracic aortic aneurysm, without rupture, unspecified: Secondary | ICD-10-CM

## 2020-08-05 DIAGNOSIS — R011 Cardiac murmur, unspecified: Secondary | ICD-10-CM | POA: Diagnosis not present

## 2020-08-05 LAB — ECHOCARDIOGRAM COMPLETE
Area-P 1/2: 2.34 cm2
S' Lateral: 3.4 cm

## 2020-08-08 ENCOUNTER — Ambulatory Visit (INDEPENDENT_AMBULATORY_CARE_PROVIDER_SITE_OTHER)
Admission: RE | Admit: 2020-08-08 | Discharge: 2020-08-08 | Disposition: A | Discharge status: Home / Self Care | Payer: 59 | Source: Ambulatory Visit | Attending: Cardiovascular Disease | Admitting: Cardiovascular Disease

## 2020-08-08 DIAGNOSIS — I712 Thoracic aortic aneurysm, without rupture, unspecified: Secondary | ICD-10-CM

## 2020-08-08 MED ORDER — PRALUENT 75 MG/ML ~~LOC~~ SOAJ
1.0000 | SUBCUTANEOUS | 11 refills | Status: DC
Start: 2020-08-08 — End: 2020-12-02

## 2020-08-08 MED ORDER — IOHEXOL 350 MG/ML SOLN
100.0000 mL | Freq: Once | INTRAVENOUS | Status: AC | PRN
  Administered 2020-08-08: 100 mL via INTRAVENOUS

## 2020-08-08 NOTE — Addendum Note (Signed)
Addended by: Skylar Priest E on: 08/08/2020 08:41 AM   Modules accepted: Orders

## 2020-08-08 NOTE — Telephone Encounter (Addendum)
Will transition to Praluent 75mg  Q2W given prior myalgias on statin therapy and well controlled LDL on Repatha and rosuvastatin 10mg  daily. Will keep a close eye on lipids and can increase dose if needed. Previously targeting LDL goal < 70, however after recent cath, plan is to to target LDL goal < 55.

## 2020-08-09 NOTE — Telephone Encounter (Signed)
Called and spoke w/pharmacy and gave them the new copay card information ad it brought the cost down to $25.

## 2020-08-11 ENCOUNTER — Emergency Department (HOSPITAL_COMMUNITY): Payer: 59

## 2020-08-11 ENCOUNTER — Other Ambulatory Visit: Payer: Self-pay

## 2020-08-11 ENCOUNTER — Emergency Department (HOSPITAL_COMMUNITY)
Admission: EM | Admit: 2020-08-11 | Discharge: 2020-08-11 | Disposition: A | Discharge status: Home / Self Care | Payer: 59 | Attending: Emergency Medicine | Admitting: Emergency Medicine

## 2020-08-11 ENCOUNTER — Encounter (HOSPITAL_COMMUNITY): Payer: Self-pay

## 2020-08-11 DIAGNOSIS — Z7982 Long term (current) use of aspirin: Secondary | ICD-10-CM | POA: Insufficient documentation

## 2020-08-11 DIAGNOSIS — I2510 Atherosclerotic heart disease of native coronary artery without angina pectoris: Secondary | ICD-10-CM | POA: Diagnosis not present

## 2020-08-11 DIAGNOSIS — R079 Chest pain, unspecified: Secondary | ICD-10-CM | POA: Insufficient documentation

## 2020-08-11 DIAGNOSIS — I10 Essential (primary) hypertension: Secondary | ICD-10-CM | POA: Insufficient documentation

## 2020-08-11 DIAGNOSIS — I25119 Atherosclerotic heart disease of native coronary artery with unspecified angina pectoris: Secondary | ICD-10-CM | POA: Diagnosis not present

## 2020-08-11 DIAGNOSIS — Z79899 Other long term (current) drug therapy: Secondary | ICD-10-CM | POA: Insufficient documentation

## 2020-08-11 DIAGNOSIS — M79602 Pain in left arm: Secondary | ICD-10-CM | POA: Diagnosis not present

## 2020-08-11 LAB — CBC
HCT: 44.1 % (ref 39.0–52.0)
Hemoglobin: 14.8 g/dL (ref 13.0–17.0)
MCH: 31.9 pg (ref 26.0–34.0)
MCHC: 33.6 g/dL (ref 30.0–36.0)
MCV: 95 fL (ref 80.0–100.0)
Platelets: 153 10*3/uL (ref 150–400)
RBC: 4.64 MIL/uL (ref 4.22–5.81)
RDW: 13.3 % (ref 11.5–15.5)
WBC: 7.9 10*3/uL (ref 4.0–10.5)
nRBC: 0 % (ref 0.0–0.2)

## 2020-08-11 LAB — BASIC METABOLIC PANEL
Anion gap: 7 (ref 5–15)
BUN: 26 mg/dL — ABNORMAL HIGH (ref 8–23)
CO2: 27 mmol/L (ref 22–32)
Calcium: 9.4 mg/dL (ref 8.9–10.3)
Chloride: 105 mmol/L (ref 98–111)
Creatinine, Ser: 0.89 mg/dL (ref 0.61–1.24)
GFR, Estimated: >60 mL/min (ref >60)
Glucose, Bld: 105 mg/dL — ABNORMAL HIGH (ref 70–99)
Potassium: 4.1 mmol/L (ref 3.5–5.1)
Sodium: 139 mmol/L (ref 135–145)

## 2020-08-11 LAB — TROPONIN I (HIGH SENSITIVITY)
Troponin I (High Sensitivity): 4 ng/L (ref <18)
Troponin I (High Sensitivity): 4 ng/L (ref <18)
Troponin I (High Sensitivity): 3 ng/L (ref <18)

## 2020-08-11 MED ORDER — ASPIRIN 81 MG PO CHEW: 243.0000 mg | CHEWABLE_TABLET | Freq: Once | ORAL | Status: DC

## 2020-08-11 MED ORDER — ISOSORBIDE MONONITRATE ER 30 MG PO TB24: 30.0000 mg | ORAL_TABLET | Freq: Every day | ORAL | 1 refills | Status: DC
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
## 2020-08-11 NOTE — Consult Note (Addendum)
Cardiology Consultation:   Patient ID: BION TODOROV MRN: [id number]; DOB: Mar 21, 1955  Admit date: 08/11/2020 Date of Consult: 08/11/2020  PCP:  Daisy Floro, MD   [facility] Providers Cardiologist:  [name], MD   Patient Profile:   Bradley Dean is a 65 y.o. male with a hx of HTN, HLD, moderate nonobstructive CAD, who is being seen 08/11/2020 for the evaluation of chest pain at the request of Dr. Stevie Kern.  History of Present Illness:   Bradley Dean is a 65 year old male with past medical history noted above.  He has been followed by Dr. Elease Hashimoto as an outpatient.  He was recently seen in the office on 7/19 with complaints that were worrisome for unstable angina.  He had a urinary calcium score done which was elevated.  Recommendations were to proceed with cardiac cath.  Underwent outpatient cardiac catheterization on 7/22 which showed heavy ostial to mid LAD calcification of 65%, 75% ostial to proximal first diagonal, 50% distal PDA.  Recommendations for aggressive risk factor modification.  Home medications include aspirin 81 mg daily, Crestor 10 mg daily, Praluent, Vascepa 1 mg twice a day, losartan 50 mg daily, propanolol 1 tablet 4 times a day as needed.   Since his cardiac cath he reports remaining very active.  He rides his bike and walks on a regular basis.  Reports yesterday he was able to bike 10 miles without symptoms.  Last evening was in his usual state of health and able to eat dinner.  Went to bed and woke up around 1 AM with burning type pain in his left arm and axilla area.  Also felt somewhat clammy.  Actually denied any significant chest pain or shortness of breath with that episode.  Symptoms were concerning that he called EMS for evaluation.  States episode lasted for about an hour before subsiding.  In the ED his labs showed stable electrolytes, high-sensitivity troponin 4>>4>>3, WBC 7.9 hemoglobin 14.8.  EKG shows sinus rhythm, 65 bpm, nonspecific changes.  Chest  x-ray negative for edema.   Past Medical History:  Diagnosis Date   Chest pain    negative stress echo in 2012   Hyperlipidemia    Hypertension     Past Surgical History:  Procedure Laterality Date   CARDIAC CATHETERIZATION     EF 65-&70% -- Normal left ventricular systolic function -- Smooth and normal coronary arteries. -- Vesta Mixer, M.D.   LEFT HEART CATH AND CORONARY ANGIOGRAPHY N/A 07/22/2020   Procedure: LEFT HEART CATH AND CORONARY ANGIOGRAPHY;  Surgeon: Lyn Records, MD;  Location: [facility] INVASIVE CV LAB;  Service: Cardiovascular;  Laterality: N/A;     Home Medications:  Prior to Admission medications   Medication Sig Start Date End Date Taking? Authorizing Provider  Alirocumab (PRALUENT) 75 MG/ML SOAJ Inject 1 pen into the skin every 14 (fourteen) days. 08/08/20  Yes Jean Alejos, Deloris Ping, MD  aspirin EC 81 MG tablet Take 81 mg by mouth at bedtime. Swallow whole.   Yes [provider]  Cholecalciferol (VITAMIN D3) 125 MCG (5000 UT) CAPS Take 5,000 Units by mouth every other day. In the morning   Yes [provider]  Coenzyme Q10 (COQ10) 200 MG CAPS Take 200 mg by mouth at bedtime.   Yes [provider]  icosapent Ethyl (VASCEPA) 1 g capsule Take 1 g by mouth 2 (two) times daily.   Yes [provider]  loratadine (CLARITIN) 10 MG tablet Take 10 mg by mouth daily as  needed for allergies.   Yes [provider]  losartan (COZAAR) 50 MG tablet Take 1 tablet (50 mg total) by mouth daily. 08/03/20  Yes Casimer Russett, Deloris Ping, MD  Menaquinone-7 (VITAMIN K2) 100 MCG CAPS Take 200 mcg by mouth in the morning.   Yes [provider]  nitroGLYCERIN (NITROSTAT) 0.4 MG SL tablet Place 1 tablet (0.4 mg total) under the tongue every 5 (five) minutes as needed for chest pain. 07/19/20  Yes Davonta Stroot, Deloris Ping, MD  propranolol (INDERAL) 10 MG tablet Take 1 tablet (10 mg total) by mouth 4 (four) times daily as needed. Patient taking differently: Take 10 mg  by mouth 4 (four) times daily as needed (pvc.). 06/01/20  Yes Venola Castello, Deloris Ping, MD  rosuvastatin (CRESTOR) 10 MG tablet Take 1 tablet by mouth daily 06/01/20  Yes Tatelyn Vanhecke, Deloris Ping, MD  SAW PALMETTO-ZINC PO Take 1 capsule by mouth daily with lunch.   Yes [provider]    Inpatient Medications: Scheduled Meds:  aspirin  243 mg Oral Once   Continuous Infusions:  PRN Meds:   Allergies:    Allergies  Allergen Reactions   Repatha [Evolocumab]     Increase in glucose   Sulfa Drugs Cross Reactors     Childhood reaction   Lipitor [Atorvastatin]     Intolerance (muscle aches)    Social History:   Social History   Socioeconomic History   Marital status: Married    Spouse name: Not on file   Number of children: Not on file   Years of education: Not on file   Highest education level: Not on file  Occupational History   Not on file  Tobacco Use   Smoking status: Never   Smokeless tobacco: Never  Vaping Use   Vaping Use: Never used  Substance and Sexual Activity   Alcohol use: No   Drug use: No   Sexual activity: Not on file  Other Topics Concern   Not on file  Social History Narrative   Not on file   Social Determinants of Health   Financial Resource Strain: Not on file  Food Insecurity: Not on file  Transportation Needs: Not on file  Physical Activity: Not on file  Stress: Not on file  Social Connections: Not on file  Intimate Partner Violence: Not on file    Family History:    Family History  Problem Relation Age of Onset   Heart disease Father      ROS:  Please see the history of present illness.   All other ROS reviewed and negative.     Physical Exam/Data:   Vitals:   08/11/20 1400 08/11/20 1415 08/11/20 1430 08/11/20 1445  BP: 125/80 128/83 121/82 137/89  Pulse: (!) 59 68 (!) 57 62  Resp: Temp:      TempSrc:      SpO2: 100% 100% 100% 97%  Weight:      Height:       No intake or output data in the 24 hours ending 08/11/20  1454 Last 3 Weights 08/11/2020 07/22/2020 07/19/2020  Weight (lbs) 194 lb 0.1 oz 194 lb 190 lb 12.8 oz  Weight (kg) 88 kg 87.998 kg 86.546 kg     Body mass index is 25.6 kg/m.  General:  Well nourished, well developed, in no acute distress HEENT: normal Neck: no JVD Vascular: No carotid bruits Cardiac:  normal S1, S2; RRR; no murmur  Lungs:  clear to auscultation bilaterally, no  wheezing, rhonchi or rales  Abd: soft, nontender, no hepatomegaly  Ext: no edema Musculoskeletal:  No deformities, BUE and BLE strength normal and equal Skin: warm and dry  Neuro:  CNs 2-12 intact, no focal abnormalities noted Psych:  Normal affect   EKG:  The EKG was personally reviewed and demonstrates:  SR, 65 bpm, nonspecific changes.  Relevant CV Studies:  Cath: 07/22/20  Heavy ostial to mid LAD calcification Widely patent left main Proximal to mid 55 to 65% nonobstructive LAD disease.  65% mid LAD disease beyond the second diagonal.  75% ostial to proximal diagonal #1. Widely patent circumflex with minimal luminal irregularity. Slightly anterior margin of the right coronary.  Segmental mid 30% narrowing within the region of calcification.  50% distal before the origin of the PDA. LVEDP 14 mmHg.  EF 55%.   RECOMMENDATIONS: Aggressive risk factor modification per Dr. Elease HashimotoNahser.  Consider adding beta-blocker.  Blood pressure controlled to less than 130/80 mmHg.  LDL less than 55.  Diagnostic Dominance: Right  Echo: 08/05/20  IMPRESSIONS     1. Left ventricular ejection fraction, by estimation, is 50 to 55%. The  left ventricle has low normal function. The left ventricle has no regional  wall motion abnormalities. Left ventricular diastolic parameters were  normal.   2. Right ventricular systolic function is mildly reduced. The right  ventricular size is mildly enlarged.   3. Left atrial size was mildly dilated.   4. The mitral valve is abnormal. Trivial mitral valve regurgitation. No  evidence of  mitral stenosis.   5. The aortic valve is tricuspid. There is mild calcification of the  aortic valve. Aortic valve regurgitation is not visualized. Mild aortic  valve sclerosis is present, with no evidence of aortic valve stenosis.   6. Aortic dilatation noted. There is mild dilatation of the ascending  aorta, measuring 38 mm.   7. The inferior vena cava is dilated in size with >50% respiratory  variability, suggesting right atrial pressure of 8 mmHg.   FINDINGS   Left Ventricle: Left ventricular ejection fraction, by estimation, is 50  to 55%. The left ventricle has low normal function. The left ventricle has  no regional wall motion abnormalities. The left ventricular internal  cavity size was normal in size.  There is no left ventricular hypertrophy. Left ventricular diastolic  parameters were normal.   Right Ventricle: The right ventricular size is mildly enlarged. Right  vetricular wall thickness was not assessed. Right ventricular systolic  function is mildly reduced.   Left Atrium: Left atrial size was mildly dilated.   Right Atrium: Right atrial size was normal in size.   Pericardium: There is no evidence of pericardial effusion.   Mitral Valve: The mitral valve is abnormal. There is mild thickening of  the mitral valve leaflet(s). There is mild calcification of the mitral  valve leaflet(s). Trivial mitral valve regurgitation. No evidence of  mitral valve stenosis.   Tricuspid Valve: The tricuspid valve is normal in structure. Tricuspid  valve regurgitation is mild . No evidence of tricuspid stenosis.   Aortic Valve: The aortic valve is tricuspid. There is mild calcification  of the aortic valve. Aortic valve regurgitation is not visualized. Mild  aortic valve sclerosis is present, with no evidence of aortic valve  stenosis.   Pulmonic Valve: The pulmonic valve was normal in structure. Pulmonic valve  regurgitation is not visualized. No evidence of pulmonic stenosis.    Aorta: The aortic root is normal in size and structure and  aortic  dilatation noted. There is mild dilatation of the ascending aorta,  measuring 38 mm.   Venous: The inferior vena cava is dilated in size with greater than 50%  respiratory variability, suggesting right atrial pressure of 8 mmHg.   IAS/Shunts: No atrial level shunt detected by color flow Doppler.   Laboratory Data:  High Sensitivity Troponin:   Recent Labs  Lab 08/11/20 0246 08/11/20 0437 08/11/20 1227  TROPONINIHS 4 4 3      Chemistry Recent Labs  Lab 08/11/20 0246  NA 139  K 4.1  CL 105  CO2 27  GLUCOSE 105*  BUN 26*  CREATININE 0.89  CALCIUM 9.4  GFRNONAA >60  ANIONGAP 7    No results for input(s): PROT, ALBUMIN, AST, ALT, ALKPHOS, BILITOT in the last 168 hours. Hematology Recent Labs  Lab 08/11/20 0246  WBC 7.9  RBC 4.64  HGB 14.8  HCT 44.1  MCV 95.0  MCH 31.9  MCHC 33.6  RDW 13.3  PLT 153   BNPNo results for input(s): BNP, PROBNP in the last 168 hours.  DDimer No results for input(s): DDIMER in the last 168 hours.   Radiology/Studies:  DG Chest 2 View  Result Date: 08/11/2020 CLINICAL DATA:  Left upper chest and shoulder pain EXAM: CHEST - 2 VIEW COMPARISON:  CT 08/05/2020 FINDINGS: A pectus deformity of the chest is again noted, resulting in some accentuation of the perihilar vasculature. No consolidation, features of edema, pneumothorax, or effusion. The cardiomediastinal contours are unremarkable. No acute osseous or soft tissue abnormality. IMPRESSION: No acute cardiopulmonary abnormality. Pectus deformity of the chest. Electronically Signed   By: 10/05/2020 M.D.   On: 08/11/2020 03:14   CT ANGIO ABDOMEN W &/OR WO CONTRAST  Result Date: 08/08/2020 CLINICAL DATA:  Pulsatile aorta.  Thoracic aortic aneurysm. EXAM: CT ANGIOGRAPHY CHEST AND ABDOMEN TECHNIQUE: Multidetector CT imaging of the chest and abdomen was performed using the standard protocol during bolus administration of  intravenous contrast. Multiplanar CT image reconstructions and MIPs were obtained to evaluate the vascular anatomy. CONTRAST:  10/08/2020 OMNIPAQUE IOHEXOL 350 MG/ML SOLN COMPARISON:  Included portion from cardiac CT 05/13/2020 FINDINGS: CTA CHEST FINDINGS Cardiovascular: Mild fusiform aneurysmal dilatation of the ascending aorta, maximal dimension 4 cm. There is no dissection, vasculitis or acute aortic findings. Conventional branching pattern from the aortic arch. Stable heart size. There are coronary artery calcifications. Small amount of fluid in the superior pericardial recess without significant pericardial effusion. There is no central pulmonary embolus, allowing for contrast tailored to aortic evaluation. Mediastinum/Nodes: No enlarged mediastinal or hilar lymph nodes. No esophageal wall thickening. No thyroid nodule. Lungs/Pleura: Calcified granuloma in both lower lobes, lingula, and right middle lobe. No noncalcified nodule. No acute airspace disease. No pleural fluid. The trachea and central bronchi are patent. Musculoskeletal: There are no acute or suspicious osseous abnormalities. Thoracic spondylosis with endplate spurring. There are no acute or suspicious osseous abnormalities. Review of the MIP images confirms the above findings. CTA ABDOMEN FINDINGS Aorta: Normal caliber aorta without aneurysm, dissection, vasculitis or significant stenosis. Minimal calcified atherosclerosis distally. Celiac: Patent without evidence of aneurysm, dissection, vasculitis or significant stenosis. SMA: Patent without evidence of aneurysm, dissection, vasculitis or significant stenosis. Renals: Single left and 2 right renal arteries are patent. No significant atherosclerosis. No aneurysm, dissection, vasculitis or fibromuscular dysplasia. IMA: Patent without evidence of aneurysm, dissection, vasculitis or significant stenosis. Inflow: Included common iliac arteries are normal in caliber. Veins: No obvious venous abnormality  within the limitations of this  arterial phase study. Hepatobiliary: No focal hepatic abnormality is seen. Gallbladder physiologically distended, no calcified stone. No biliary dilatation. Pancreas: No ductal dilatation or inflammation. No evidence of pancreatic mass. Spleen: Normal in size and arterial enhancement. Adrenals/Urinary Tract: Normal adrenal glands. Homogeneous renal enhancement. No hydronephrosis simple cyst in the upper left kidney measures 3.9 cm. No solid or suspicious renal lesion. Stomach/Bowel: The stomach is decompressed. Normal positioning of the duodenum and ligament of Treitz. Included small and large bowel are unremarkable without inflammation. No wall thickening. Lymphatic: No adenopathy. Musculoskeletal: There are degenerative Modic endplate changes in the upper lumbar spine with endplate sclerosis, disc space narrowing and subchondral cystic change. No acute osseous finding or focal lesion. Other: No free fluid.  No abdominal wall hernia. Review of the MIP images confirms the above findings. IMPRESSION: 1. Mild fusiform aneurysmal dilatation of the ascending aorta, maximal dimension 4 cm. No aortic dissection or acute aortic abnormality. Recommend annual imaging followup by CTA or MRA. This recommendation follows 2010 ACCF/AHA/AATS/ACR/ASA/SCA/SCAI/SIR/STS/SVM Guidelines for the Diagnosis and Management of Patients with Thoracic Aortic Disease. Circulation. 2010; 121: W098-J191. Aortic aneurysm NOS (ICD10-I71.9) 2. Minimal abdominal aortic atherosclerosis. No abdominal aortic aneurysm. 3. No acute abnormality in the chest or abdomen. 4. Sequela of prior granulomatous disease with calcified nodules within both lungs. 5. Coronary artery calcifications. 6. Benign left renal cyst. Aortic Atherosclerosis (ICD10-I70.0). Electronically Signed   By: Narda Rutherford M.D.   On: 08/08/2020 19:39   CT ANGIO CHEST AORTA W/CM & OR WO/CM  Result Date: 08/08/2020 CLINICAL DATA:  Pulsatile aorta.   Thoracic aortic aneurysm. EXAM: CT ANGIOGRAPHY CHEST AND ABDOMEN TECHNIQUE: Multidetector CT imaging of the chest and abdomen was performed using the standard protocol during bolus administration of intravenous contrast. Multiplanar CT image reconstructions and MIPs were obtained to evaluate the vascular anatomy. CONTRAST:  OMNIPAQUE IOHEXOL 350 MG/ML SOLN COMPARISON:  Included portion from cardiac CT 05/13/2020 FINDINGS: CTA CHEST FINDINGS Cardiovascular: Mild fusiform aneurysmal dilatation of the ascending aorta, maximal dimension 4 cm. There is no dissection, vasculitis or acute aortic findings. Conventional branching pattern from the aortic arch. Stable heart size. There are coronary artery calcifications. Small amount of fluid in the superior pericardial recess without significant pericardial effusion. There is no central pulmonary embolus, allowing for contrast tailored to aortic evaluation. Mediastinum/Nodes: No enlarged mediastinal or hilar lymph nodes. No esophageal wall thickening. No thyroid nodule. Lungs/Pleura: Calcified granuloma in both lower lobes, lingula, and right middle lobe. No noncalcified nodule. No acute airspace disease. No pleural fluid. The trachea and central bronchi are patent. Musculoskeletal: There are no acute or suspicious osseous abnormalities. Thoracic spondylosis with endplate spurring. There are no acute or suspicious osseous abnormalities. Review of the MIP images confirms the above findings. CTA ABDOMEN FINDINGS Aorta: Normal caliber aorta without aneurysm, dissection, vasculitis or significant stenosis. Minimal calcified atherosclerosis distally. Celiac: Patent without evidence of aneurysm, dissection, vasculitis or significant stenosis. SMA: Patent without evidence of aneurysm, dissection, vasculitis or significant stenosis. Renals: Single left and 2 right renal arteries are patent. No significant atherosclerosis. No aneurysm, dissection, vasculitis or fibromuscular  dysplasia. IMA: Patent without evidence of aneurysm, dissection, vasculitis or significant stenosis. Inflow: Included common iliac arteries are normal in caliber. Veins: No obvious venous abnormality within the limitations of this arterial phase study. Hepatobiliary: No focal hepatic abnormality is seen. Gallbladder physiologically distended, no calcified stone. No biliary dilatation. Pancreas: No ductal dilatation or inflammation. No evidence of pancreatic mass. Spleen: Normal in size and arterial enhancement. Adrenals/Urinary  Tract: Normal adrenal glands. Homogeneous renal enhancement. No hydronephrosis simple cyst in the upper left kidney measures 3.9 cm. No solid or suspicious renal lesion. Stomach/Bowel: The stomach is decompressed. Normal positioning of the duodenum and ligament of Treitz. Included small and large bowel are unremarkable without inflammation. No wall thickening. Lymphatic: No adenopathy. Musculoskeletal: There are degenerative Modic endplate changes in the upper lumbar spine with endplate sclerosis, disc space narrowing and subchondral cystic change. No acute osseous finding or focal lesion. Other: No free fluid.  No abdominal wall hernia. Review of the MIP images confirms the above findings. IMPRESSION: 1. Mild fusiform aneurysmal dilatation of the ascending aorta, maximal dimension 4 cm. No aortic dissection or acute aortic abnormality. Recommend annual imaging followup by CTA or MRA. This recommendation follows 2010 ACCF/AHA/AATS/ACR/ASA/SCA/SCAI/SIR/STS/SVM Guidelines for the Diagnosis and Management of Patients with Thoracic Aortic Disease. Circulation. 2010; 121: O962-X528. Aortic aneurysm NOS (ICD10-I71.9) 2. Minimal abdominal aortic atherosclerosis. No abdominal aortic aneurysm. 3. No acute abnormality in the chest or abdomen. 4. Sequela of prior granulomatous disease with calcified nodules within both lungs. 5. Coronary artery calcifications. 6. Benign left renal cyst. Aortic  Atherosclerosis (ICD10-I70.0). Electronically Signed   By: Narda Rutherford M.D.   On: 08/08/2020 19:39     Assessment and Plan:   Bradley Dean is a 65 y.o. male with a hx of HTN, HLD, moderate nonobstructive CAD, who is being seen 08/11/2020 for the evaluation of chest pain at the request of Dr. Stevie Kern.  Chest pain: symptoms woke him from sleep this morning around 1am with left arm burning. This was different than chest pain he has had in the past. EKG without acute change, hsTn 4>>4>>3. No further symptoms while in the ED. Discussed with patient options for medical management with titration of meds vs repeat cath with flow wire testing. Definitive conversation with MD, patient prefers to start Imdur  daily and follow up in the office.  -- will send Rx for discharge   HLD:  on crestor  daily, along with praluent   HTN: controlled on losartan  daily  AAA: mild fusiform aneurysmal dilatation of ascending aorta of 4cm. Annual screening with CTA/MRA   For questions or updates, please contact [facility] Please consult [email or web address] for contact info under    Signed, Laverda Page, NP  08/11/2020 2:54 PM   Attending Note:   The patient was seen and examined.  Agree with assessment and plan as noted above.  Changes made to the above note as needed.  Patient seen and independently examined with Laverda Page, NP.   We discussed all aspects of the encounter. I agree with the assessment and plan as stated above.     Left arm pain.   Unsure of etiology.   Occurred > 12 hours after he went cycling. Not asociated with dyspnea   , no sweats.   He did not try SL NTG .   Troponins are negative.   ECG is unremarkable  I think this was likely a MSK issue. To try Naprosyn Add Imdur 30 mg a day  Encouraged him to take SL NTG if he has further symptoms similar to this one  2.   CAD :  has moderate LAD disease.   Cont aggressive medical therapy .   If he has additional cp or  similar angina like pains, we may need to repeat his cath and evaluate the LAD with a flow wire .  3.  HLD:  cont meds.  I have spent a total of 40 minutes with patient reviewing hospital  notes , telemetry, EKGs, labs and examining patient as well as establishing an assessment and plan that was discussed with the patient.  > 50% of time was spent in direct patient care.    Vesta Mixer, Montez Hageman., MD, [facility] 08/11/2020, 3:00 PM [address]  [address] Office [phone] Pager [phone]

## 2020-08-11 NOTE — ED Provider Notes (Signed)
[facility] Provider Note   CSN: [id number] Arrival date & time: 08/11/20  0151     History Chief Complaint  Patient presents with   Arm Pain    Bradley Dean is a 65 y.o. male.   Arm Pain Associated symptoms include chest pain. Pertinent negatives include no abdominal pain, no headaches and no shortness of breath.    Patient is a 65 year old male with a past medical history of hypertension, CAD presenting for left arm/armpit pain and left-sided chest pain.  Patient states that he was sleeping when he woke up at 1 AM and had pain in his left armpit and left arm  He denied any shortness of breath with his left sided chest pain and left arm pain. He described the pain as burning.  He also had a sensation of clamminess during this episode.  He denies any chest pain radiating to his back. He denies any abdominal pain, nausea, weakness or numbness.  He takes aspirin 81 mg daily.  He is not on anticoagulation. Patient states that he rode his bike yesterday for an hour.  Of note patient recently had a cardiac catheterization by cardiology on 7/22 in which he was found to have heavy LAD calcification with 50 to 70% regions of narrowing from proximal to mid vessel.  Ostial to proximal second diagonal was 75%.  At this current time patient denies having any chest pain, left arm pain, left arm pain.  He states he feels at his baseline.  Patient denies any recent fevers, illnesses, headache, neck stiffness, shortness of breath, vomiting, diarrhea, numbness or weakness.    Past Medical History:  Diagnosis Date   Chest pain    negative stress echo in 2012   Hyperlipidemia    Hypertension     Patient Active Problem List   Diagnosis Date Noted   Coronary artery disease involving coronary bypass graft of native heart with angina pectoris (HCC) 06/01/2020   Hyperlipidemia 05/11/2020   Family history of early CAD 05/11/2020   Chest pain 07/04/2010    Hypertension 07/04/2010    Past Surgical History:  Procedure Laterality Date   CARDIAC CATHETERIZATION     EF 65-&70% -- Normal left ventricular systolic function -- Smooth and normal coronary arteries. -- Vesta MixerPhilip J. Nahser, M.D.   LEFT HEART CATH AND CORONARY ANGIOGRAPHY N/A 07/22/2020   Procedure: LEFT HEART CATH AND CORONARY ANGIOGRAPHY;  Surgeon: Lyn RecordsSmith, Henry W, MD;  [facility];  Service: Cardiovascular;  Laterality: N/A;       Family History  Problem Relation Age of Onset   Heart disease Father     Social History   Tobacco Use   Smoking status: Never   Smokeless tobacco: Never  Vaping Use   Vaping Use: Never used  Substance Use Topics   Alcohol use: No   Drug use: No    Home Medications Prior to Admission medications   Medication Sig Start Date End Date Taking? Authorizing Provider  Alirocumab (PRALUENT) 75 MG/ML SOAJ Inject 1 pen into the skin every 14 (fourteen) days. 08/08/20  Yes Nahser, Deloris PingPhilip J, MD  aspirin EC 81 MG tablet Take 81 mg by mouth at bedtime. Swallow whole.   Yes [provider]  Cholecalciferol (VITAMIN D3) 125 MCG (5000 UT) CAPS Take 5,000 Units by mouth every other day. In the morning   Yes [provider]  Coenzyme Q10 (COQ10) 200 MG CAPS Take 200 mg by mouth at bedtime.  Yes [provider]  icosapent Ethyl (VASCEPA) 1 g capsule Take 1 g by mouth 2 (two) times daily.   Yes [provider]  isosorbide mononitrate (IMDUR) 30 MG 24 hr tablet Take 1 tablet (30 mg total) by mouth daily. 08/11/20 02/07/21 Yes Arty Baumgartner, NP  loratadine (CLARITIN) 10 MG tablet Take 10 mg by mouth daily as needed for allergies.   Yes [provider]  losartan (COZAAR) 50 MG tablet Take 1 tablet (50 mg total) by mouth daily. 08/03/20  Yes Nahser, Deloris Ping, MD  Menaquinone-7 (VITAMIN K2) 100 MCG CAPS Take 200 mcg by mouth in the morning.   Yes [provider]  nitroGLYCERIN (NITROSTAT) 0.4 MG SL tablet  Place 1 tablet (0.4 mg total) under the tongue every 5 (five) minutes as needed for chest pain. 07/19/20  Yes Nahser, Deloris Ping, MD  propranolol (INDERAL) 10 MG tablet Take 1 tablet (10 mg total) by mouth 4 (four) times daily as needed. Patient taking differently: Take 10 mg by mouth 4 (four) times daily as needed (pvc.). 06/01/20  Yes Nahser, Deloris Ping, MD  rosuvastatin (CRESTOR) 10 MG tablet Take 1 tablet by mouth daily 06/01/20  Yes Nahser, Deloris Ping, MD  SAW PALMETTO-ZINC PO Take 1 capsule by mouth daily with lunch.   Yes [provider]    Allergies    Repatha [evolocumab], Sulfa drugs cross reactors, and Lipitor [atorvastatin]  Review of Systems   Review of Systems  Constitutional:  Negative for chills, diaphoresis, fatigue and fever.  HENT:  Negative for congestion, dental problem, ear pain, facial swelling, hearing loss, nosebleeds, postnasal drip, rhinorrhea, sore throat and trouble swallowing.   Eyes:  Negative for photophobia, pain and visual disturbance.  Respiratory:  Negative for apnea, cough, choking, chest tightness, shortness of breath, wheezing and stridor.   Cardiovascular:  Positive for chest pain. Negative for palpitations and leg swelling.  Gastrointestinal:  Negative for abdominal distention, abdominal pain, constipation, diarrhea, nausea and vomiting.  Endocrine: Negative for polydipsia and polyuria.  Genitourinary:  Negative for difficulty urinating, dysuria, flank pain, frequency, hematuria and urgency.  Musculoskeletal:  Negative for gait problem, myalgias, neck pain and neck stiffness.       Left arm pain  Skin:  Negative for rash and wound.  Allergic/Immunologic: Negative for environmental allergies and food allergies.  Neurological:  Negative for dizziness, tremors, seizures, syncope, facial asymmetry, speech difficulty, light-headedness, numbness and headaches.  Psychiatric/Behavioral:  Negative for behavioral problems and confusion.   All other systems  reviewed and are negative.  Physical Exam Updated Vital Signs BP 130/89   Pulse (!) 59   Temp 98.8 F (37.1 C)   Resp 13   Ht 6\' 1"  (1.854 m)   Wt 88 kg   SpO2 97%   BMI 25.60 kg/m   Physical Exam Vitals and nursing note reviewed.  Constitutional:      General: He is not in acute distress.    Appearance: Normal appearance. He is normal weight.  HENT:     Head: Normocephalic and atraumatic.     Right Ear: External ear normal.     Left Ear: External ear normal.     Nose: Nose normal. No congestion.     Mouth/Throat:     Mouth: Mucous membranes are moist.     Pharynx: Oropharynx is clear. No oropharyngeal exudate or posterior oropharyngeal erythema.  Eyes:     General: No visual field deficit.    Extraocular Movements: Extraocular movements intact.  Conjunctiva/sclera: Conjunctivae normal.     Pupils: Pupils are equal, round, and reactive to light.  Cardiovascular:     Rate and Rhythm: Normal rate and regular rhythm.     Pulses: Normal pulses.     Heart sounds: Normal heart sounds. No murmur heard.   No friction rub. No gallop.  Pulmonary:     Effort: Pulmonary effort is normal. No respiratory distress.     Breath sounds: Normal breath sounds. No stridor. No wheezing, rhonchi or rales.  Chest:     Chest wall: No tenderness.  Abdominal:     General: Abdomen is flat. Bowel sounds are normal. There is no distension.     Palpations: Abdomen is soft.     Tenderness: There is no abdominal tenderness. There is no right CVA tenderness, left CVA tenderness, guarding or rebound.  Musculoskeletal:        General: No swelling or tenderness. Normal range of motion.     Cervical back: Normal range of motion and neck supple. No rigidity, tenderness or bony tenderness.     Thoracic back: Normal. No tenderness or bony tenderness.     Lumbar back: Normal. No tenderness or bony tenderness.     Right lower leg: No edema.     Left lower leg: No edema.  Skin:    General: Skin is warm  and dry.  Neurological:     General: No focal deficit present.     Mental Status: He is alert and oriented to person, place, and time. Mental status is at baseline.     Cranial Nerves: Cranial nerves are intact. No cranial nerve deficit, dysarthria or facial asymmetry.     Sensory: Sensation is intact. No sensory deficit.     Motor: Motor function is intact. No weakness.     Coordination: Coordination is intact. Finger-Nose-Finger Test normal.     Gait: Gait is intact. Gait normal.  Psychiatric:        Mood and Affect: Mood normal.        Behavior: Behavior normal.        Thought Content: Thought content normal.        Judgment: Judgment normal.    ED Results / Procedures / Treatments   Labs (all labs ordered are listed, but only abnormal results are displayed) Labs Reviewed  BASIC METABOLIC PANEL - Abnormal; Notable for the following components:      Result Value   Glucose, Bld 105 (*)    BUN 26 (*)    All other components within normal limits  CBC  TROPONIN I (HIGH SENSITIVITY)  TROPONIN I (HIGH SENSITIVITY)  TROPONIN I (HIGH SENSITIVITY)    EKG EKG Interpretation  Date/Time:  Thursday August 11 2020 02:42:19 EDT Ventricular Rate:  65 PR Interval:  118 QRS Duration: 94 QT Interval:  424 QTC Calculation: 440 R Axis:   -28 Text Interpretation: Normal sinus rhythm Normal ECG When compared with ECG of 11/21/2002, No significant change was found Confirmed by Dione Booze ([id number]) on 08/11/2020 3:49:47 AM  Radiology DG Chest 2 View  Result Date: 08/11/2020 CLINICAL DATA:  Left upper chest and shoulder pain EXAM: CHEST - 2 VIEW COMPARISON:  CT 08/05/2020 FINDINGS: A pectus deformity of the chest is again noted, resulting in some accentuation of the perihilar vasculature. No consolidation, features of edema, pneumothorax, or effusion. The cardiomediastinal contours are unremarkable. No acute osseous or soft tissue abnormality. IMPRESSION: No acute cardiopulmonary abnormality.  Pectus deformity of the chest. Electronically Signed  By: Kreg Shropshire M.D.   On: 08/11/2020 03:14    Procedures Procedures   Medications Ordered in ED Medications  aspirin chewable tablet 243 mg (0 mg Oral Hold 08/11/20 1225)    ED Course  I have reviewed the triage vital signs and the nursing notes.  Pertinent labs & imaging results that were available during my care of the patient were reviewed by me and considered in my medical decision making (see chart for details).    MDM Rules/Calculators/A&P                         Bradley Dean is a 65 y.o. male with a past medical history of hypertension, CAD presenting for left arm/armpit pain and left-sided chest pain.  Patient is hemodynamically stable and in no acute distress.  Patient's physical exam is notable for equal pulses in all 4 extremities.  He does not have any lower extremity edema or JVD.  Patient states that he is currently at his baseline and is not experiencing any chest pain or left arm pain.  Patient denies any chest pain or shortness of breath.  At this time do not think patient is experiencing pericarditis, CHF exacerbation, PE, or aortic dissection.  However, with patient's recent cardiac cath on 7/22 concern for ACS such as unstable angina.  Patient's last cardiac cath showed heavy LAD calcification with 50 to 70% regions of narrowing from proximal to mid vessel.  Ostial to proximal second diagonal was 75%.  Cardiology consulted for evaluation.  Patient's EKG shows no acute signs of ischemia.  Chest x-ray showed no acute cardiac or pulmonary abnormality.  CBC and BMP was unremarkable.  Troponin x3 were negative.  Cardiology was consulted and evaluated the patient. Cardiology evaluated the patient and recommending discharge home.  They recommended adding Imdur 30 mg a day.  Patient will follow up with cardiology for further evaluation.  Patient was involved with his PCP.  Patient states compliance and understanding of the  plan. I explained labs and imaging to the patient. No further questions at this time from the patient.  The patient is safe and stable for discharge at this time with return precautions provided and a plan for follow-up care in place as needed.  The plan for this patient was discussed with Dr. Stevie Kern, who voiced agreement and who oversaw evaluation and treatment of this patient.   Final Clinical Impression(s) / ED Diagnoses Final diagnoses:  Chest pain, unspecified type    Rx / DC Orders ED Discharge Orders          Ordered    isosorbide mononitrate (IMDUR) 30 MG 24 hr tablet  Daily        08/11/20 1456             Lottie Dawson, MD 08/11/20 1637    Milagros Loll, MD 08/12/20 [phone]

## 2020-08-11 NOTE — ED Triage Notes (Signed)
Woke up to left arm and left elbow pain. Denies any chest pain and SOB.Recent CABG. IV g20 left ac

## 2020-08-11 NOTE — ED Provider Notes (Signed)
Emergency Medicine Provider Triage Evaluation Note  Bradley Dean , a 65 y.o. male  was evaluated in triage.  Pt complains of pain to the L upper chest and shoulder. Pain present upon waking. Describes the pain as "burning" which was present down to his L elbow. Had some associated "clamminess". No SOB, back pain, abdominal pain, lower extremity weakness. Takes 81mg  ASA QHS. No chronic anticoagulation. Had cardiac catheterization by Dr. on 07/19/20 which showed CAD, but did not necessitate PCI. Hx of HTN, HLD. Intolerant to statins.  Review of Systems  Positive: L chest and shoulder pain Negative: Back pain, abdominal pain, weakness  Physical Exam  There were no vitals taken for this visit. Gen:   Awake, no distress   Resp:  Normal effort  MSK:   Moves extremities without difficulty  Other:  Pleasant and nontoxic appearing  Medical Decision Making  Medically screening exam initiated at 2:37 AM.  Appropriate orders placed.  07/21/20 Bradley Dean was informed that the remainder of the evaluation will be completed by another provider, this initial triage assessment does not replace that evaluation, and the importance of remaining in the ED until their evaluation is complete.  L upper chest pain   Bradley Apley, PA-C 08/11/20 0240    10/11/20, MD 08/11/20 [phone]

## 2020-08-11 NOTE — Discharge Instructions (Signed)
Please follow-up with your PCP and cardiology.  Please take Imdur 1 tablet by mouth daily.  Please return to our ED if this chest pain continues or experiencing shortness of breath.

## 2020-08-16 ENCOUNTER — Other Ambulatory Visit: Payer: Self-pay | Admitting: Cardiovascular Disease

## 2020-09-05 ENCOUNTER — Encounter: Payer: Self-pay | Admitting: Cardiovascular Disease

## 2020-09-05 NOTE — Progress Notes (Signed)
Cardiology Office Note:    Date:  09/06/2020   ID:  Bradley Dean, DOB Sep 02, 1955, MRN [id number]  PCP:  Daisy Floro, MD   [facility] Providers Cardiologist:  [name], MD     Referring MD: Daisy Floro, MD   Chief Complaint  Patient presents with   Coronary Artery Disease   Hyperlipidemia    May 11, 2020   Bradley Dean is a 64 y.o. male with a hx of HTN, HLD I last saw Bradley Dean in 2012. He has been seeing Bradley Fredrickson, NP Family hx of CAD .  Had a remote cath in 2004  Works for [employer]. No CP or dyspnea  Does a fair amount of cycling without any difficulty  Has some orthostatic hypotension  Has had borderline hyperglycemia   BP has been well controlled.  . Has a + family hx of CAD Father died in his 86s of MI Brother died in his 30 s   Did not tolerate atorvastatin ( muscle aches)  Helped with Co-Q 10  Tolerates a low dose  rosuvastatin    July 19, 2020:  Bradley Dean is seen today for follow up of his HLD and coronary artery calcification  Seen with wife, Bradley Dean .  Coronary calcium score of 1646. This was 62 percentile for age-, race-, and sex-matched controls. We started ASA 81 mg a day  Lexiscan myoview showed no ischemia.  Normal LV function  He is here today to discuss   With exercise, ( biking) ,  gets PVCs when his HR is 130-140, will have PVCs  - several seconds of arrhythmia  Also when he is walking  Also has chest tightness , indigestion like sensation with exertion  No other GI issues   Sept. 6, 2022:  Seen with wife,  Bradley Dean is seen today for follow up visit for his CAD and a recent ER visit .  Cath on July 19, 2020 shows significant proximal -mid LAD calcification.   Has mid LAD of 65% , ostial diag is 75%. He presented to the ER on Aug. 11. Troponins were negative . CP was thought to be MSK pain  We added Imdur 30 mg a day .   Naprosyn was added.  Has not had any recurrent left arm pain  Headaches  are better Has not had any specific CP  Has had some rib and back soreness Is having some PVC like symptoms  Rode his bike yesterday - felt well after he warmed up    Has a question about his bill from a recent cath   Past Medical History:  Diagnosis Date   Chest pain    negative stress echo in 2012   Hyperlipidemia    Hypertension     Past Surgical History:  Procedure Laterality Date   CARDIAC CATHETERIZATION     EF 65-&70% -- Normal left ventricular systolic function -- Smooth and normal coronary arteries. -- Vesta Mixer, M.D.   LEFT HEART CATH AND CORONARY ANGIOGRAPHY N/A 07/22/2020   Procedure: LEFT HEART CATH AND CORONARY ANGIOGRAPHY;  Surgeon: Lyn Records, MD;  Location: [facility] INVASIVE CV LAB;  Service: Cardiovascular;  Laterality: N/A;    Current Medications: Current Meds  Medication Sig   Alirocumab (PRALUENT) 75 MG/ML SOAJ Inject 1 pen into the skin every 14 (fourteen) days.   aspirin EC 81 MG tablet Take 81 mg by mouth at bedtime. Swallow whole.   Cholecalciferol (VITAMIN D3) 125  MCG (5000 UT) CAPS Take 5,000 Units by mouth every other day. In the morning   Coenzyme Q10 (COQ10) 200 MG CAPS Take 200 mg by mouth at bedtime.   icosapent Ethyl (VASCEPA) 1 g capsule Take 1 g by mouth 2 (two) times daily.   isosorbide mononitrate (IMDUR) 30 MG 24 hr tablet Take 1 tablet (30 mg total) by mouth daily.   loratadine (CLARITIN) 10 MG tablet Take 10 mg by mouth daily as needed for allergies.   losartan (COZAAR) 50 MG tablet Take 1 tablet (50 mg total) by mouth daily.   Menaquinone-7 (VITAMIN K2) 100 MCG CAPS Take 200 mcg by mouth in the morning.   nitroGLYCERIN (NITROSTAT) 0.4 MG SL tablet Place 1 tablet (0.4 mg total) under the tongue every 5 (five) minutes as needed for chest pain.   propranolol (INDERAL) 10 MG tablet Take 1 tablet (10 mg total) by mouth 4 (four) times daily as needed (pvc.).   rosuvastatin (CRESTOR) 10 MG tablet Take 1 tablet by mouth daily   SAW  PALMETTO-ZINC PO Take 1 capsule by mouth daily with lunch.     Allergies:   Repatha [evolocumab], Sulfa drugs cross reactors, and Lipitor [atorvastatin]   Social History   Socioeconomic History   Marital status: Married    Spouse name: Not on file   Number of children: Not on file   Years of education: Not on file   Highest education level: Not on file  Occupational History   Not on file  Tobacco Use   Smoking status: Never   Smokeless tobacco: Never  Vaping Use   Vaping Use: Never used  Substance and Sexual Activity   Alcohol use: No   Drug use: No   Sexual activity: Not on file  Other Topics Concern   Not on file  Social History Narrative   Not on file   Social Determinants of Health   Financial Resource Strain: Not on file  Food Insecurity: Not on file  Transportation Needs: Not on file  Physical Activity: Not on file  Stress: Not on file  Social Connections: Not on file     Family History: The patient's family history includes Heart disease in his father.  ROS:   Please see the history of present illness.     All other systems reviewed and are negative.  EKGs/Labs/Other Studies Reviewed:    The following studies were reviewed today:   Recent Labs: 07/19/2020: ALT 15 08/11/2020: BUN 26; Creatinine, Ser 0.89; Hemoglobin 14.8; Platelets 153; Potassium 4.1; Sodium 139  Recent Lipid Panel    Component Value Date/Time   CHOL 203 (H) 05/11/2020 1551   TRIG 78 05/11/2020 1551   HDL 50 05/11/2020 1551   CHOLHDL 4.1 05/11/2020 1551   CHOLHDL 3.7 06/28/2015 0857   VLDL 28 06/28/2015 0857   LDLCALC 139 (H) 05/11/2020 1551     Risk Assessment/Calculations:       Physical Exam:    Physical Exam: Blood pressure 124/72, pulse (!) 55, height 6\' 1"  (1.854 m), weight 191 lb 3.2 oz (86.7 kg), SpO2 97 %.  GEN:  Well nourished, well developed in no acute distress HEENT: Normal NECK: No JVD; No carotid bruits LYMPHATICS: No lymphadenopathy CARDIAC: RRR ,  soft systolic murmur  RESPIRATORY:  Clear to auscultation without rales, wheezing or rhonchi  ABDOMEN: Soft, non-tender, non-distended MUSCULOSKELETAL:  No edema; No deformity  SKIN: Warm and dry NEUROLOGIC:  Alert and oriented x 3   EKG:  ASSESSMENT:    No diagnosis found.  PLAN:     Unstable angina:   His symptoms seem to be a little bit better. Continue current medications.  He will give me a call at the thinks that his symptoms are worsening. We discussed recently that we would possibly repeat his cath using FFR to fully evaluate his proximal and mid LAD lesions.  The stenosis are very heavily calcified and he would need atherectomy plus stenting or perhaps even bypass grafting for revascularization.   2.  Hyperlipidemia:    Lipids have been fairly stable.  3.   Palpable abdominal aorta :     4.  Mild asc. Aortic dilatation .    5.  Systolic murmur:    stable      Medication Adjustments/Labs and Tests Ordered: Current medicines are reviewed at length with the patient today.  Concerns regarding medicines are outlined above.  No orders of the defined types were placed in this encounter.   No orders of the defined types were placed in this encounter.    Patient Instructions  Medication Instructions:  Your physician recommends that you continue on your current medications as directed. Please refer to the Current Medication list given to you today.  *If you need a refill on your cardiac medications before your next appointment, please call your pharmacy*   Lab Work: None Ordered If you have labs (blood work) drawn today and your tests are completely normal, you will receive your results only by: MyChart Message (if you have MyChart) OR A paper copy in the mail If you have any lab test that is abnormal or we need to change your treatment, we will call you to review the results.   Testing/Procedures: None Ordered   Follow-Up: At [facility], you and  your health needs are our priority.  As part of our continuing mission to provide you with exceptional heart care, we have created designated Provider Care Teams.  These Care Teams include your primary Cardiologist (physician) and Advanced Practice Providers (APPs -  Physician Assistants and Nurse Practitioners) who all work together to provide you with the care you need, when you need it.  Your next appointment:   6 month(s) on Friday March 17  The format for your next appointment:   In Person  Provider:   [name], MD    Signed, [name], MD  09/06/2020 10:35 AM    [facility]

## 2020-09-06 ENCOUNTER — Encounter: Payer: Self-pay | Admitting: Cardiovascular Disease

## 2020-09-06 ENCOUNTER — Ambulatory Visit (INDEPENDENT_AMBULATORY_CARE_PROVIDER_SITE_OTHER): Payer: 59 | Admitting: Cardiovascular Disease

## 2020-09-06 ENCOUNTER — Other Ambulatory Visit: Payer: Self-pay

## 2020-09-06 VITALS — BP 124/72 | HR 55 | Ht 73.0 in | Wt 191.2 lb

## 2020-09-06 DIAGNOSIS — I25709 Atherosclerosis of coronary artery bypass graft(s), unspecified, with unspecified angina pectoris: Secondary | ICD-10-CM

## 2020-09-06 DIAGNOSIS — E7801 Familial hypercholesterolemia: Secondary | ICD-10-CM | POA: Diagnosis not present

## 2020-09-06 DIAGNOSIS — I2510 Atherosclerotic heart disease of native coronary artery without angina pectoris: Secondary | ICD-10-CM | POA: Insufficient documentation

## 2020-09-06 NOTE — Patient Instructions (Signed)
Medication Instructions:  Your physician recommends that you continue on your current medications as directed. Please refer to the Current Medication list given to you today.  *If you need a refill on your cardiac medications before your next appointment, please call your pharmacy*   Lab Work: None Ordered If you have labs (blood work) drawn today and your tests are completely normal, you will receive your results only by: MyChart Message (if you have MyChart) OR A paper copy in the mail If you have any lab test that is abnormal or we need to change your treatment, we will call you to review the results.   Testing/Procedures: None Ordered   Follow-Up: At [facility], you and your health needs are our priority.  As part of our continuing mission to provide you with exceptional heart care, we have created designated Provider Care Teams.  These Care Teams include your primary Cardiologist (physician) and Advanced Practice Providers (APPs -  Physician Assistants and Nurse Practitioners) who all work together to provide you with the care you need, when you need it.  Your next appointment:   6 month(s) on Friday March 17  The format for your next appointment:   In Person  Provider:   [name], MD

## 2020-09-14 NOTE — Addendum Note (Signed)
Addended by: Lisbeth Puller E on: 09/14/2020 12:55 PM   Modules accepted: Orders

## 2020-09-26 ENCOUNTER — Other Ambulatory Visit: Payer: 59 | Admitting: *Deleted

## 2020-09-26 ENCOUNTER — Other Ambulatory Visit: Payer: Self-pay

## 2020-09-26 DIAGNOSIS — E782 Mixed hyperlipidemia: Secondary | ICD-10-CM

## 2020-09-27 LAB — NMR, LIPOPROFILE
Cholesterol, Total: 111 mg/dL (ref 100–199)
HDL Particle Number: 29.1 umol/L — ABNORMAL LOW (ref >=30.5)
HDL-C: 52 mg/dL (ref >39)
LDL Particle Number: 631 nmol/L (ref <1000)
LDL Size: 20 nm — ABNORMAL LOW (ref >20.5)
LDL-C (NIH Calc): 48 mg/dL (ref 0–99)
LP-IR Score: <25 (ref <=45)
Small LDL Particle Number: 353 nmol/L (ref <=527)
Triglycerides: 42 mg/dL (ref 0–149)

## 2020-10-22 ENCOUNTER — Other Ambulatory Visit: Payer: Self-pay | Admitting: Cardiovascular Disease

## 2020-10-25 ENCOUNTER — Ambulatory Visit: Payer: 59 | Admitting: Cardiovascular Disease

## 2020-12-02 ENCOUNTER — Telehealth: Payer: Self-pay | Admitting: Cardiovascular Disease

## 2020-12-02 ENCOUNTER — Encounter: Payer: Self-pay | Admitting: Pharmacist

## 2020-12-02 MED ORDER — PRALUENT 75 MG/ML ~~LOC~~ SOAJ: 1.0000 "pen " | SUBCUTANEOUS | 11 refills | Status: DC

## 2020-12-02 NOTE — Telephone Encounter (Signed)
Refill sent in

## 2020-12-02 NOTE — Telephone Encounter (Signed)
  *  STAT* If patient is at the pharmacy, call can be transferred to refill team.   1. Which medications need to be refilled? (please list name of each medication and dose if known) Alirocumab (PRALUENT) 75 MG/ML SOAJ  2. Which pharmacy/location (including street and city if local pharmacy) is medication to be sent to? [facility] [id number] - [place], [place] - [address]  3. Do they need a 30 day or 90 day supply? 30 days

## 2020-12-17 ENCOUNTER — Other Ambulatory Visit: Payer: Self-pay | Admitting: Cardiovascular Disease

## 2021-01-26 ENCOUNTER — Ambulatory Visit
Admission: RE | Admit: 2021-01-26 | Discharge: 2021-01-26 | Disposition: A | Discharge status: Home / Self Care | Payer: 59 | Source: Ambulatory Visit | Attending: Sports Medicine | Admitting: Sports Medicine

## 2021-01-26 ENCOUNTER — Other Ambulatory Visit: Payer: Self-pay | Admitting: Sports Medicine

## 2021-01-26 ENCOUNTER — Other Ambulatory Visit: Payer: Self-pay | Admitting: Cardiology

## 2021-01-26 DIAGNOSIS — M25561 Pain in right knee: Secondary | ICD-10-CM

## 2021-01-26 DIAGNOSIS — M79672 Pain in left foot: Secondary | ICD-10-CM

## 2021-02-21 ENCOUNTER — Encounter: Payer: Self-pay | Admitting: Pharmacist

## 2021-02-22 MED ORDER — REPATHA SURECLICK 140 MG/ML ~~LOC~~ SOAJ: 1.0000 "pen " | SUBCUTANEOUS | 11 refills | Status: DC

## 2021-03-12 ENCOUNTER — Encounter: Payer: Self-pay | Admitting: Cardiovascular Disease

## 2021-03-12 NOTE — Progress Notes (Signed)
Cardiology Office Note:    Date:  03/13/2021   ID:  Bradley Dean, DOB Jan 12, 1955, MRN [id number]  PCP:  Lawerance Cruel, MD   [facility] Cardiologist:  Mertie Moores, MD     Referring MD: Lawerance Cruel, MD   Chief Complaint  Patient presents with   Hyperlipidemia    May 11, 2020   Bradley Dean is a 66 y.o. male with a hx of HTN, HLD I last saw Kohan in 2012. He has been seeing Truitt Merle, NP Family hx of CAD .  Had a remote cath in 2004  Works for [employer]. No CP or dyspnea  Does a fair amount of cycling without any difficulty  Has some orthostatic hypotension  Has had borderline hyperglycemia   BP has been well controlled.  . Has a + family hx of CAD Father died in his 31s of MI Brother died in his 36 s   Did not tolerate atorvastatin ( muscle aches)  Helped with Co-Q 10  Tolerates a low dose  rosuvastatin    July 19, 2020:  [name] is seen today for follow up of his HLD and coronary artery calcification  Seen with wife, Arbutus Ped .  Coronary calcium score of 1646. This was 27 percentile for age-, race-, and sex-matched controls. We started ASA 81 mg a day  Lexiscan myoview showed no ischemia.  Normal LV function  He is here today to discuss   With exercise, ( biking) ,  gets PVCs when his HR is 130-140, will have PVCs  - several seconds of arrhythmia  Also when he is walking  Also has chest tightness , indigestion like sensation with exertion  No other GI issues   Sept. 6, 2022:  Seen with wife,  Bradley Dean is seen today for follow up visit for his CAD and a recent ER visit .  Cath on July 19, 2020 shows significant proximal -mid LAD calcification.   Has mid LAD of 65% , ostial diag is 75%. He presented to the ER on Aug. 11. Troponins were negative . CP was thought to be MSK pain  We added Imdur 30 mg a day .   Naprosyn was added.  Has not had any recurrent left arm pain  Headaches are better Has not had any  specific CP  Has had some rib and back soreness Is having some PVC like symptoms  Rode his bike yesterday - felt well after he warmed up    Has a question about his bill from a recent cath   March 13, 2021  Bradley Dean  is seen today for follow up of his CAD Had a cath in July , 2022   Has mid LAD stenosis of 65%, D1 75% Was treated medically with imdur   No recent episodes of CP  No recent ER visits  Glucose and HbA1C has been going up - ? 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      Past Medical History:  Diagnosis Date   Chest pain    negative stress echo in 2012   Hyperlipidemia    Hypertension     Past Surgical History:  Procedure Laterality Date   CARDIAC CATHETERIZATION     EF 65-&70% -- Normal left ventricular systolic function -- Smooth and normal coronary arteries. -- Thayer Headings, M.D.   LEFT HEART CATH AND CORONARY ANGIOGRAPHY N/A 07/22/2020   Procedure: LEFT HEART CATH AND CORONARY ANGIOGRAPHY;  Surgeon: Belva Crome, MD;  Location: [facility];  Service: Cardiovascular;  Laterality: N/A;    Current Medications: Current Meds  Medication Sig   aspirin EC 81 MG tablet Take 81 mg by mouth at bedtime. Swallow whole.   Cholecalciferol (VITAMIN D3) 125 MCG (5000 UT) CAPS Take 5,000 Units by mouth every other day. In the morning   Coenzyme Q10 (COQ10) 200 MG CAPS Take 200 mg by mouth at bedtime.   Evolocumab (REPATHA SURECLICK) [id number] MG/ML SOAJ Inject 1 pen into the skin every 14 (fourteen) days.   icosapent Ethyl (VASCEPA) 1 g capsule Take 1 g by mouth 2 (two) times daily.   isosorbide mononitrate (IMDUR) 30 MG 24 hr tablet TAKE ONE TABLET BY MOUTH DAILY   loratadine (CLARITIN) 10 MG tablet Take 10 mg by mouth daily  as needed for allergies.   losartan (COZAAR) 50 MG tablet TAKE 1 TABLET BY MOUTH  DAILY   Menaquinone-7 (VITAMIN K2) 100 MCG CAPS Take 200 mcg by mouth in the morning.   metFORMIN (GLUCOPHAGE-XR) 500 MG 24 hr tablet Take 500 mg by mouth daily.   nitroGLYCERIN (NITROSTAT) 0.4 MG SL tablet Place 1 tablet (0.4 mg total) under the tongue every 5 (five) minutes as needed for chest pain.   propranolol (INDERAL) 10 MG tablet TAKE 1 TABLET BY MOUTH 4  TIMES DAILY AS NEEDED FOR  PREMATURE VENTRICULAR  CONTRACTIONS   rosuvastatin (CRESTOR) 10 MG tablet Take 1 tablet by mouth daily   SAW PALMETTO-ZINC PO Take 1 capsule by mouth daily with lunch.     Allergies:   Fluticasone, Sulfa drugs cross reactors, and Lipitor [atorvastatin]   Social History   Socioeconomic History   Marital status: Married    Spouse name: Not on file   Number of children: Not on file   Years of education: Not on file   Highest education level: Not on file  Occupational History   Not on file  Tobacco Use   Smoking status: Never   Smokeless tobacco: Never  Vaping Use   Vaping Use: Never used  Substance and Sexual Activity   Alcohol use: No   Drug use: No   Sexual activity: Not on file  Other Topics Concern   Not on file  Social History Narrative   Not on file   Social Determinants of Health   Financial Resource Strain: Not on file  Food Insecurity: Not on file  Transportation Needs: Not on file  Physical Activity: Not on file  Stress: Not on file  Social Connections: Not on file     Family History: The patient's family history includes Heart disease in his father.  ROS:   Please see the history of present illness.     All other systems reviewed and are negative.  EKGs/Labs/Other Studies Reviewed:    The following studies were reviewed today:   Recent Labs: 07/19/2020: ALT 15 08/11/2020: BUN 26; Creatinine, Ser 0.89; Hemoglobin 14.8; Platelets 153; Potassium 4.1; Sodium 139  Recent Lipid Panel     Component Value Date/Time   CHOL 203 (H) 05/11/2020 1551   TRIG 78 05/11/2020 1551   HDL 50 05/11/2020 1551   CHOLHDL 4.1 05/11/2020 1551  CHOLHDL 3.7 06/28/2015 0857   VLDL 28 06/28/2015 0857   LDLCALC 139 (H) 05/11/2020 1551     Risk Assessment/Calculations:       Physical Exam:    Physical Exam: Blood pressure 126/72, pulse 65, height 6\' 1"  (1.854 m), weight 201 lb (91.2 kg), SpO2 97 %.  GEN:  Well nourished, well developed in no acute distress HEENT: Normal NECK: No JVD; No carotid bruits LYMPHATICS: No lymphadenopathy CARDIAC: RRR , no murmurs, rubs, gallops RESPIRATORY:  Clear to auscultation without rales, wheezing or rhonchi  ABDOMEN: Soft, non-tender, non-distended MUSCULOSKELETAL:  No edema; No deformity  SKIN: Warm and dry NEUROLOGIC:  Alert and oriented x 3    EKG:     ASSESSMENT:    1. Coronary artery disease involving coronary bypass graft of native heart with angina pectoris ([place])   2. Hyperlipidemia, unspecified hyperlipidemia type   3. Aortic dilatation ([facility])     PLAN:     Unstable angina:    no recent angina.   Cont meds. Has not had to use any nTG.  Is active.     2.  Hyperlipidemia:   check lipids, ATL, BMP today    3.   Mild aortic dilatation: He has had an echocardiogram from last year that showed mild dilatation of his ascending aorta measuring 38 mm.  CTA performed a month or so later revealed the aorta was 4.0 cm.  We will plan on getting an echocardiogram in approximately 1 year.  We can plan on following with echo with periodic CT angiograms as needed.       Medication Adjustments/Labs and Tests Ordered: Current medicines are reviewed at length with the patient today.  Concerns regarding medicines are outlined above.  Orders Placed This Encounter  Procedures   ALT   Basic metabolic panel   Lipid panel   ECHOCARDIOGRAM COMPLETE     No orders of the defined types were placed in this encounter.     Patient Instructions   Medication Instructions:  Your physician recommends that you continue on your current medications as directed. Please refer to the Current Medication list given to you today.  *If you need a refill on your cardiac medications before your next appointment, please call your pharmacy*  Lab Work: TODAY: Lipids, ALT, BMP If you have labs (blood work) drawn today and your tests are completely normal, you will receive your results only by: [place] (if you have MyChart) OR A paper copy in the mail If you have any lab test that is abnormal or we need to change your treatment, we will call you to review the results.  Testing/Procedures: Your physician has requested that you have an echocardiogram 1 week prior to follow-up office visit March 2024. Echocardiography is a painless test that uses sound waves to create images of your heart. It provides your doctor with information about the size and shape of your heart and how well your hearts chambers and valves are working. This procedure takes approximately one hour. There are no restrictions for this procedure.   Follow-Up: At [facility], you and your health needs are our priority.  As part of our continuing mission to provide you with exceptional heart care, we have created designated Provider Care Teams.  These Care Teams include your primary Cardiologist (physician) and Advanced Practice Providers (APPs -  Physician Assistants and Nurse Practitioners) who all work together to provide you with the care you need, when you need it.  Your next appointment:  1 year(s)  The format for your next appointment:   In Person  Provider:   Mertie Moores, MD  or Robbie Lis, PA-C or Richardson Dopp, PA-C   Signed, Mertie Moores, MD  03/13/2021 5:20 PM    [place]

## 2021-03-13 ENCOUNTER — Encounter: Payer: Self-pay | Admitting: Cardiovascular Disease

## 2021-03-13 ENCOUNTER — Ambulatory Visit (INDEPENDENT_AMBULATORY_CARE_PROVIDER_SITE_OTHER): Payer: 59 | Admitting: Cardiovascular Disease

## 2021-03-13 ENCOUNTER — Other Ambulatory Visit: Payer: Self-pay

## 2021-03-13 VITALS — BP 126/72 | HR 65 | Ht 73.0 in | Wt 201.0 lb

## 2021-03-13 DIAGNOSIS — E785 Hyperlipidemia, unspecified: Secondary | ICD-10-CM | POA: Diagnosis not present

## 2021-03-13 DIAGNOSIS — I25709 Atherosclerosis of coronary artery bypass graft(s), unspecified, with unspecified angina pectoris: Secondary | ICD-10-CM | POA: Diagnosis not present

## 2021-03-13 DIAGNOSIS — I10 Essential (primary) hypertension: Secondary | ICD-10-CM | POA: Diagnosis not present

## 2021-03-13 DIAGNOSIS — I77819 Aortic ectasia, unspecified site: Secondary | ICD-10-CM

## 2021-03-13 LAB — LIPID PANEL
Chol/HDL Ratio: 1.9 ratio (ref 0.0–5.0)
Cholesterol, Total: 97 mg/dL — ABNORMAL LOW (ref 100–199)
HDL: 50 mg/dL (ref >39)
LDL Chol Calc (NIH): 33 mg/dL (ref 0–99)
Triglycerides: 65 mg/dL (ref 0–149)
VLDL Cholesterol Cal: 14 mg/dL (ref 5–40)

## 2021-03-13 LAB — BASIC METABOLIC PANEL
BUN/Creatinine Ratio: 22 (ref 10–24)
BUN: 20 mg/dL (ref 8–27)
CO2: 26 mmol/L (ref 20–29)
Calcium: 9.6 mg/dL (ref 8.6–10.2)
Chloride: 101 mmol/L (ref 96–106)
Creatinine, Ser: 0.89 mg/dL (ref 0.76–1.27)
Glucose: 105 mg/dL — ABNORMAL HIGH (ref 70–99)
Potassium: 4.6 mmol/L (ref 3.5–5.2)
Sodium: 139 mmol/L (ref 134–144)
eGFR: 95 mL/min/{1.73_m2} (ref >59)

## 2021-03-13 LAB — ALT: ALT: 22 IU/L (ref 0–44)

## 2021-03-13 NOTE — Patient Instructions (Signed)
Medication Instructions:  ?Your physician recommends that you continue on your current medications as directed. Please refer to the Current Medication list given to you today. ? ?*If you need a refill on your cardiac medications before your next appointment, please call your pharmacy* ? ?Lab Work: ?TODAY: Lipids, ALT, BMP ?If you have labs (blood work) drawn today and your tests are completely normal, you will receive your results only by: ?MyChart Message (if you have MyChart) OR ?A paper copy in the mail ?If you have any lab test that is abnormal or we need to change your treatment, we will call you to review the results. ? ?Testing/Procedures: ?Your physician has requested that you have an echocardiogram 1 week prior to follow-up office visit March 2024. Echocardiography is a painless test that uses sound waves to create images of your heart. It provides your doctor with information about the size and shape of your heart and how well your heart?s chambers and valves are working. This procedure takes approximately one hour. There are no restrictions for this procedure. ? ? ?Follow-Up: ?At [facility], you and your health needs are our priority.  As part of our continuing mission to provide you with exceptional heart care, we have created designated Provider Care Teams.  These Care Teams include your primary Cardiologist (physician) and Advanced Practice Providers (APPs -  Physician Assistants and Nurse Practitioners) who all work together to provide you with the care you need, when you need it. ? ?Your next appointment:   ?1 year(s) ? ?The format for your next appointment:   ?In Person ? ?Provider:   ?[name], MD  or [name], PA-C or [name], [place] ?

## 2021-03-17 ENCOUNTER — Other Ambulatory Visit: Payer: Self-pay | Admitting: Cardiovascular Disease

## 2021-03-17 ENCOUNTER — Ambulatory Visit: Payer: 59 | Admitting: Cardiovascular Disease

## 2021-03-20 ENCOUNTER — Other Ambulatory Visit: Payer: Self-pay

## 2021-03-20 MED ORDER — ISOSORBIDE MONONITRATE ER 30 MG PO TB24: 30.0000 mg | ORAL_TABLET | Freq: Every day | ORAL | 3 refills | Status: DC

## 2021-04-21 ENCOUNTER — Other Ambulatory Visit: Payer: Self-pay | Admitting: Cardiovascular Disease

## 2021-04-25 ENCOUNTER — Encounter: Payer: Self-pay | Admitting: Pharmacist

## 2021-05-30 ENCOUNTER — Encounter: Payer: Self-pay | Admitting: Cardiovascular Disease

## 2021-05-30 MED ORDER — OMEPRAZOLE 20 MG PO CPDR: 20.0000 mg | DELAYED_RELEASE_CAPSULE | Freq: Every day | ORAL | 3 refills | Status: DC

## 2021-05-30 NOTE — Telephone Encounter (Signed)
Called and spoke to patient who states that the pain in his back is located mid-back near shoulder blades and he describes as an "achy, twist/turn that's posture related." He states that he has arthritis and often has pain here following long walks or working out. He condones a very active day during the daytime of this episode and he states that with rest/ice/heat, this pain resolves. Pt condones the chest discomfort feels like what it has in the past. States that he's had 3-4 events similar to this in the last "few years" where he's woke up during the night with the discomfort and sweating and actually went to hospital and each time nothing cardiovascular-related has been positive or discovered. Pt states this is why he is currently taking the Isosorbide. Pt states he takes propranolol preventatively if he knows he's going to be exercising-states he's taken 3 in the last week, so doesn't feel like PVC's are excessive or worsened. No CP at time of call, no SOB with episode. States he hasn't checked his BP "in the last couple of days" but that it's been "running 120/70's, not has high as the 130/80's that it was in the past." Pt does not take anything for acid reflux and cannot say for sure if past episodes have been triggered by foods, but does say that 3-4 weeks ago (last time this occurred) he had a cookout at his house and overate and did wake up in the night hours with same symptoms. Advised pt that I will route to MD for review, but he may benefit from taking an OTC PPI to see if this improves his symptoms and allow him to trial spicy foods to see if the symptoms are replicated.

## 2021-05-30 NOTE — Telephone Encounter (Signed)
Reached out to patient who in unavailable to come in on 05/02/22 due to a death in the family and the funeral being held in [place]. OV has been scheduled for Tuesday 06/06/21. Omeprazole sent into [facility] per pt request.

## 2021-05-30 NOTE — Addendum Note (Signed)
Addended by: Lars Mage on: 05/30/2021 05:33 PM   Modules accepted: Orders

## 2021-06-06 ENCOUNTER — Encounter: Payer: Self-pay | Admitting: Cardiovascular Disease

## 2021-06-06 ENCOUNTER — Ambulatory Visit (INDEPENDENT_AMBULATORY_CARE_PROVIDER_SITE_OTHER): Payer: 59 | Admitting: Cardiovascular Disease

## 2021-06-06 VITALS — BP 124/76 | HR 51 | Ht 73.0 in | Wt 200.2 lb

## 2021-06-06 DIAGNOSIS — I25119 Atherosclerotic heart disease of native coronary artery with unspecified angina pectoris: Secondary | ICD-10-CM

## 2021-06-06 DIAGNOSIS — Z01810 Encounter for preprocedural cardiovascular examination: Secondary | ICD-10-CM

## 2021-06-06 DIAGNOSIS — I77819 Aortic ectasia, unspecified site: Secondary | ICD-10-CM | POA: Diagnosis not present

## 2021-06-06 DIAGNOSIS — R079 Chest pain, unspecified: Secondary | ICD-10-CM | POA: Diagnosis not present

## 2021-06-06 LAB — TROPONIN T: Troponin T (Highly Sensitive): 10 ng/L (ref 0–22)

## 2021-06-06 NOTE — Progress Notes (Signed)
Cardiology Office Note:    Date:  06/08/2021   ID:  Bradley Dean, DOB [birth year], MRN [id number]  PCP:  Daisy Floro, MD   [facility] Providers Cardiologist:  [name], MD     Referring MD: Daisy Floro, MD   Chief Complaint  Patient presents with   Coronary Artery Disease   Hyperlipidemia    May 11, 2020   Bradley Dean is a 66 y.o. male with a hx of HTN, HLD I last saw Bradley Dean in 2012. He has been seeing Bradley Fredrickson, NP Family hx of CAD .  Had a remote cath in 2004  Works for [employer]. No CP or dyspnea  Does a fair amount of cycling without any difficulty  Has some orthostatic hypotension  Has had borderline hyperglycemia   BP has been well controlled.  . Has a + family hx of CAD Father died in his 24s of MI Brother died in his 82 s   Did not tolerate atorvastatin ( muscle aches)  Helped with Co-Q 10  Tolerates a low dose  rosuvastatin    July 19, 2020:  Bradley Dean is seen today for follow up of his HLD and coronary artery calcification  Seen with wife, [name] .  Coronary calcium score of 1646. This was 91 percentile for age-, race-, and sex-matched controls. We started ASA 81 mg a day  Lexiscan myoview showed no ischemia.  Normal LV function  He is here today to discuss   With exercise, ( biking) ,  gets PVCs when his HR is 130-140, will have PVCs  - several seconds of arrhythmia  Also when he is walking  Also has chest tightness , indigestion like sensation with exertion  No other GI issues   Sept. 6, 2022:  Seen with wife,  Bradley Dean is seen today for follow up visit for his CAD and a recent ER visit .  Cath on July 19, 2020 shows significant proximal -mid LAD calcification.   Has mid LAD of 65% , ostial diag is 75%. He presented to the ER on Aug. 11. Troponins were negative . CP was thought to be MSK pain  We added Imdur 30 mg a day .   Naprosyn was added.  Has not had any recurrent left arm pain  Headaches  are better Has not had any specific CP  Has had some rib and back soreness Is having some PVC like symptoms  Rode his bike yesterday - felt well after he warmed up    Has a question about his bill from a recent cath   March 13, 2021  Bradley Dean  is seen today for follow up of his CAD Had a cath in July , 2022   Has mid LAD stenosis of 65%, D1 75% Was treated medically with imdur   No recent episodes of CP  No recent ER visits  Glucose and HbA1C has been going up - ? 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   June 06, 2021: Bradley Dean is seen today for follow up visit  For his CAD, Mildly dilated ascending aorta. CTA shows asc. Aorta size of 4.0 cm. Has normal LV function.  1 week ago ( May 29)  , went to the beach. Family stress On memorial day , walked ~ 6 miles ( 20,000 steps)  Did not have any issues while exercising  Has noted some intrascapular pain with exercise  But also has noted some stiffness of his spine Ate some spicy food, woke up with abd discomfort, some chest pain , sweats .  Took a SL NTG  - did not help the discomfort  30 min later, the pain gradually resolved,   no residual pain   Will get a Troponin T today   Past Medical History:  Diagnosis Date   Chest pain    negative stress echo in 2012   Hyperlipidemia    Hypertension     Past Surgical History:  Procedure Laterality Date   CARDIAC CATHETERIZATION     EF 65-&70% -- Normal left ventricular systolic function -- Smooth and normal coronary arteries. -- Vesta Mixer, M.D.   LEFT HEART CATH AND CORONARY ANGIOGRAPHY N/A 07/22/2020   Procedure: LEFT HEART CATH AND CORONARY ANGIOGRAPHY;  Surgeon: Lyn Records, MD;  Location: [facility] INVASIVE CV LAB;  Service:  Cardiovascular;  Laterality: N/A;    Current Medications: Current Meds  Medication Sig   aspirin EC 81 MG tablet Take 81 mg by mouth at bedtime. Swallow whole.   Cholecalciferol (VITAMIN D3) 125 MCG (5000 UT) CAPS Take 5,000 Units by mouth every other day. In the morning   Coenzyme Q10 (COQ10) 200 MG CAPS Take 200 mg by mouth at bedtime.   Evolocumab (REPATHA SURECLICK) 140 MG/ML SOAJ Inject 1 pen into the skin every 14 (fourteen) days.   icosapent Ethyl (VASCEPA) 1 g capsule Take 1 g by mouth 2 (two) times daily.   isosorbide mononitrate (IMDUR) 30 MG 24 hr tablet Take 1 tablet (30 mg total) by mouth daily.   loratadine (CLARITIN) 10 MG tablet Take 10 mg by mouth daily as needed for allergies.   losartan (COZAAR) 50 MG tablet TAKE 1 TABLET BY MOUTH  DAILY   Menaquinone-7 (VITAMIN K2) 100 MCG CAPS Take 200 mcg by mouth in the morning.   metFORMIN (GLUCOPHAGE-XR) 500 MG 24 hr tablet Take 500 mg by mouth daily.   nitroGLYCERIN (NITROSTAT) 0.4 MG SL tablet DISSOLVE 1 TABLET UNDER THE TONGUE EVERY 5 MINUTES AS  NEEDED FOR CHEST PAIN. MAX  OF 3 TABLETS IN 15 MINUTES. CALL 911 IF PAIN PERSISTS.   omeprazole (PRILOSEC) 20 MG capsule Take 1 capsule (20 mg total) by mouth daily.   propranolol (INDERAL) 10 MG tablet TAKE 1 TABLET BY MOUTH 4  TIMES DAILY AS NEEDED FOR  PREMATURE VENTRICULAR  CONTRACTIONS   rosuvastatin (CRESTOR) 10 MG tablet TAKE 1 TABLET BY MOUTH  DAILY   SAW PALMETTO-ZINC PO Take 1 capsule by mouth daily with lunch.     Allergies:   Fluticasone, Sulfa antibiotics, Sulfa drugs cross reactors, and Lipitor [atorvastatin]   Social History   Socioeconomic History   Marital status: Married    Spouse name: Not on file   Number of children: Not on file   Years of education: Not on file   Highest education level: Not on file  Occupational History   Not on file  Tobacco Use   Smoking status: Never   Smokeless tobacco: Never  Vaping Use   Vaping Use: Never used  Substance and  Sexual Activity   Alcohol use: No   Drug use: No   Sexual activity: Not on file  Other  Topics Concern   Not on file  Social History Narrative   Not on file   Social Determinants of Health   Financial Resource Strain: Not on file  Food Insecurity: Not on file  Transportation Needs: Not on file  Physical Activity: Not on file  Stress: Not on file  Social Connections: Not on file     Family History: The patient's family history includes Heart disease in his father.  ROS:   Please see the history of present illness.     All other systems reviewed and are negative.  EKGs/Labs/Other Studies Reviewed:    The following studies were reviewed today:   Recent Labs: 08/11/2020: Hemoglobin 14.8; Platelets 153 03/13/2021: ALT 22; BUN 20; Creatinine, Ser 0.89; Potassium 4.6; Sodium 139  Recent Lipid Panel    Component Value Date/Time   CHOL 97 (L) 03/13/2021 1014   TRIG 65 03/13/2021 1014   HDL 50 03/13/2021 1014   CHOLHDL 1.9 03/13/2021 1014   CHOLHDL 3.7 06/28/2015 0857   VLDL 28 06/28/2015 0857   LDLCALC 33 03/13/2021 1014     Risk Assessment/Calculations:       Physical Exam:   Physical Exam: Blood pressure 124/76, pulse (!) 51, height 6\' 1"  (1.854 m), weight 200 lb 3.2 oz (90.8 kg), SpO2 97 %.  GEN:  Well nourished, well developed in no acute distress HEENT: Normal NECK: No JVD; No carotid bruits LYMPHATICS: No lymphadenopathy CARDIAC: RRR , no murmurs, rubs, gallops RESPIRATORY:  Clear to auscultation without rales, wheezing or rhonchi  ABDOMEN: Soft, non-tender, non-distended MUSCULOSKELETAL:  No edema; No deformity  SKIN: Warm and dry NEUROLOGIC:  Alert and oriented x 3  EKG:   June 06, 2021: Sinus bradycardia 51.  No ST or T wave changes  ASSESSMENT:    1. Chest pain of uncertain etiology   2. Coronary artery disease involving native coronary artery of native heart with angina pectoris (HCC)   3. Acquired dilation of ascending aorta and aortic root (HCC)    4. Pre-procedural cardiovascular examination      PLAN:     Unstable angina:   Bradley Dean had an episode of chest discomfort about 8 days ago.  The pain lasted for several hours and he thinks it might of been indigestion.  It resolved by morning.  We will draw troponin T to make sure we have a good understanding of whether or not it was an acute coronary syndrome.  There is no EKG abnormalities.  He has known mild coronary artery disease.  2.  Hyperlipidemia:      3.   Mild aortic dilatation: He has mild aortic dilatation by CT angiogram last year.  We will anticipate repeating the CTA in August.        Medication Adjustments/Labs and Tests Ordered: Current medicines are reviewed at length with the patient today.  Concerns regarding medicines are outlined above.  Orders Placed This Encounter  Procedures   CT ANGIO CHEST AORTA W/CM & OR WO/CM   Troponin T   Basic metabolic panel   EKG 12-Lead     No orders of the defined types were placed in this encounter.     Patient Instructions  Medication Instructions:  Your physician recommends that you continue on your current medications as directed. Please refer to the Current Medication list given to you today. *If you need a refill on your cardiac medications before your next appointment, please call your pharmacy*  Lab Work: Troponin T today If you have labs (  blood work) drawn today and your tests are completely normal, you will receive your results only by: MyChart Message (if you have MyChart) OR A paper copy in the mail If you have any lab test that is abnormal or we need to change your treatment, we will call you to review the results.  Testing/Procedures: Coronary CTA Your physician has requested that you have cardiac CT. Cardiac computed tomography (CT) is a painless test that uses an x-ray machine to take clear, detailed pictures of your heart. For further information please visit [email or web address] Please follow  instruction sheet as given.  Follow-Up: At [facility], you and your health needs are our priority.  As part of our continuing mission to provide you with exceptional heart care, we have created designated Provider Care Teams.  These Care Teams include your primary Cardiologist (physician) and Advanced Practice Providers (APPs -  Physician Assistants and Nurse Practitioners) who all work together to provide you with the care you need, when you need it.  We recommend signing up for the patient portal called "MyChart".  Sign up information is provided on this After Visit Summary.  MyChart is used to connect with patients for Virtual Visits (Telemedicine).  Patients are able to view lab/test results, encounter notes, upcoming appointments, etc.  Non-urgent messages can be sent to your provider as well.   To learn more about what you can do with MyChart, go to [email or web address].    Your next appointment:   6 month(s)  The format for your next appointment:   In Person  Provider:   [name], MD    Important Information About Sugar         Signed, [name], MD  06/08/2021 6:36 PM    [facility]

## 2021-06-06 NOTE — Patient Instructions (Signed)
Medication Instructions:  Your physician recommends that you continue on your current medications as directed. Please refer to the Current Medication list given to you today. *If you need a refill on your cardiac medications before your next appointment, please call your pharmacy*  Lab Work: Troponin T today If you have labs (blood work) drawn today and your tests are completely normal, you will receive your results only by: MyChart Message (if you have MyChart) OR A paper copy in the mail If you have any lab test that is abnormal or we need to change your treatment, we will call you to review the results.  Testing/Procedures: Coronary CTA Your physician has requested that you have cardiac CT. Cardiac computed tomography (CT) is a painless test that uses an x-ray machine to take clear, detailed pictures of your heart. For further information please visit [email or web address] Please follow instruction sheet as given.  Follow-Up: At [facility], you and your health needs are our priority.  As part of our continuing mission to provide you with exceptional heart care, we have created designated Provider Care Teams.  These Care Teams include your primary Cardiologist (physician) and Advanced Practice Providers (APPs -  Physician Assistants and Nurse Practitioners) who all work together to provide you with the care you need, when you need it.  We recommend signing up for the patient portal called "MyChart".  Sign up information is provided on this After Visit Summary.  MyChart is used to connect with patients for Virtual Visits (Telemedicine).  Patients are able to view lab/test results, encounter notes, upcoming appointments, etc.  Non-urgent messages can be sent to your provider as well.   To learn more about what you can do with MyChart, go to [email or web address].    Your next appointment:   6 month(s)  The format for your next appointment:   In Person  Provider:   [name], MD     Important Information About Sugar

## 2021-07-07 ENCOUNTER — Other Ambulatory Visit: Payer: Self-pay | Admitting: Cardiovascular Disease

## 2021-08-02 ENCOUNTER — Other Ambulatory Visit: Payer: 59

## 2021-08-02 DIAGNOSIS — Z01810 Encounter for preprocedural cardiovascular examination: Secondary | ICD-10-CM

## 2021-08-02 LAB — BASIC METABOLIC PANEL
BUN/Creatinine Ratio: 27 — ABNORMAL HIGH (ref 10–24)
BUN: 23 mg/dL (ref 8–27)
CO2: 25 mmol/L (ref 20–29)
Calcium: 9.8 mg/dL (ref 8.6–10.2)
Chloride: 99 mmol/L (ref 96–106)
Creatinine, Ser: 0.86 mg/dL (ref 0.76–1.27)
Glucose: 100 mg/dL — ABNORMAL HIGH (ref 70–99)
Potassium: 4.4 mmol/L (ref 3.5–5.2)
Sodium: 139 mmol/L (ref 134–144)
eGFR: 96 mL/min/{1.73_m2} (ref >59)

## 2021-08-09 ENCOUNTER — Encounter (HOSPITAL_BASED_OUTPATIENT_CLINIC_OR_DEPARTMENT_OTHER): Payer: Self-pay

## 2021-08-09 ENCOUNTER — Ambulatory Visit (HOSPITAL_BASED_OUTPATIENT_CLINIC_OR_DEPARTMENT_OTHER)
Admission: RE | Admit: 2021-08-09 | Discharge: 2021-08-09 | Disposition: A | Discharge status: Home / Self Care | Payer: 59 | Source: Ambulatory Visit | Attending: Cardiovascular Disease | Admitting: Cardiovascular Disease

## 2021-08-09 DIAGNOSIS — I25119 Atherosclerotic heart disease of native coronary artery with unspecified angina pectoris: Secondary | ICD-10-CM | POA: Insufficient documentation

## 2021-08-09 DIAGNOSIS — R079 Chest pain, unspecified: Secondary | ICD-10-CM | POA: Diagnosis present

## 2021-08-09 DIAGNOSIS — I77819 Aortic ectasia, unspecified site: Secondary | ICD-10-CM | POA: Diagnosis present

## 2021-08-09 MED ORDER — IOHEXOL 350 MG/ML SOLN
100.0000 mL | Freq: Once | INTRAVENOUS | Status: AC | PRN
  Administered 2021-08-09: 75 mL via INTRAVENOUS

## 2021-08-15 ENCOUNTER — Telehealth: Payer: Self-pay

## 2021-08-15 DIAGNOSIS — I7121 Aneurysm of the ascending aorta, without rupture: Secondary | ICD-10-CM

## 2021-08-15 NOTE — Telephone Encounter (Signed)
Patient has viewed results of current CT scan and findings of unchanged thoracic aortic aneurysm via MyChart. Order placed at this time for repeat study in 1 year.

## 2021-08-15 NOTE — Telephone Encounter (Signed)
-----   Message from Vesta Mixer, MD sent at 08/13/2021  8:53 PM EDT -----  Ascending aortic aneurism of 4.0 cm No changes from last years study Repeat CT angio of aorta in 1 year

## 2021-09-07 ENCOUNTER — Other Ambulatory Visit: Payer: Self-pay | Admitting: Cardiovascular Disease

## 2021-09-17 ENCOUNTER — Encounter: Payer: Self-pay | Admitting: Cardiovascular Disease

## 2021-09-18 MED ORDER — LOSARTAN POTASSIUM 50 MG PO TABS: 50.0000 mg | ORAL_TABLET | Freq: Every day | ORAL | 2 refills | Status: DC

## 2021-09-18 MED ORDER — ROSUVASTATIN CALCIUM 10 MG PO TABS: 10.0000 mg | ORAL_TABLET | Freq: Every day | ORAL | 2 refills | Status: DC

## 2021-10-04 ENCOUNTER — Encounter: Payer: Self-pay | Admitting: Cardiovascular Disease

## 2021-10-15 ENCOUNTER — Other Ambulatory Visit: Payer: Self-pay | Admitting: Cardiovascular Disease

## 2021-12-03 ENCOUNTER — Encounter: Payer: Self-pay | Admitting: Cardiovascular Disease

## 2021-12-03 NOTE — Progress Notes (Unsigned)
Cardiology Office Note:    Date:  12/04/2021   ID:  Bradley Dean, DOB 07-29-1955, MRN [id number]  PCP:  Lawerance Cruel, MD   [place] Cardiologist:  Mertie Moores, MD     Referring MD: Lawerance Cruel, MD   Chief Complaint  Patient presents with   Coronary Artery Disease         Hyperlipidemia    May 11, 2020   Bradley Dean is a 66 y.o. male with a hx of HTN, HLD I last saw Bradley Dean in 2012. He has been seeing Bradley Merle, NP Family hx of CAD .  Had a remote cath in 2004  Works for [employer]. No CP or dyspnea  Does a fair amount of cycling without any difficulty  Has some orthostatic hypotension  Has had borderline hyperglycemia   BP has been well controlled.  . Has a + family hx of CAD Father died in his 71s of MI Brother died in his 60 s   Did not tolerate atorvastatin ( muscle aches)  Helped with Co-Q 10  Tolerates a low dose  rosuvastatin    July 19, 2020:  Bradley Dean is seen today for follow up of his HLD and coronary artery calcification  Seen with wife, Arbutus Ped .  Coronary calcium score of 1646. This was 44 percentile for age-, race-, and sex-matched controls. We started ASA 81 mg a day  Lexiscan myoview showed no ischemia.  Normal LV function  He is here today to discuss   With exercise, ( biking) ,  gets PVCs when his HR is 130-140, will have PVCs  - several seconds of arrhythmia  Also when he is walking  Also has chest tightness , indigestion like sensation with exertion  No other GI issues   Sept. 6, 2022:  Seen with wife,  Bradley Dean is seen today for follow up visit for his CAD and a recent ER visit .  Cath on July 19, 2020 shows significant proximal -mid LAD calcification.   Has mid LAD of 65% , ostial diag is 75%. He presented to the ER on Aug. 11. Troponins were negative . CP was thought to be MSK pain  We added Imdur 30 mg a day .   Naprosyn was added.  Has not had any recurrent left arm pain   Headaches are better Has not had any specific CP  Has had some rib and back soreness Is having some PVC like symptoms  Rode his bike yesterday - felt well after he warmed up    Has a question about his bill from a recent cath   March 13, 2021  Bradley Dean  is seen today for follow up of his CAD Had a cath in July , 2022   Has mid LAD stenosis of 65%, D1 75% Was treated medically with imdur   No recent episodes of CP  No recent ER visits  Glucose and HbA1C has been going up - ? 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   June 06, 2021: Bradley Dean is seen today for follow up visit  For his CAD, Mildly dilated ascending aorta. CTA shows asc. Aorta size of  4.0 cm. Has normal LV function.  1 week ago ( May 29)  , went to the beach. Family stress On memorial day , walked ~ 6 miles ( 20,000 steps)  Did not have any issues while exercising  Has noted some intrascapular pain with exercise  But also has noted some stiffness of his spine Ate some spicy food, woke up with abd discomfort, some chest pain , sweats .  Took a SL NTG  - did not help the discomfort  30 min later, the pain gradually resolved,   no residual pain   Will get a Troponin T today   Dec. 4, 2023 Bradley Dean is seen today for follow up of his CAD, Had CP worrisome fo UAP in June Troponin was normal  CTA of the aorta showed a stable 4.0 cm asc. Aorta  needs follow up CTA of aorta next year    Has had some palpitations - clinically sound like PVCs  He has known PVCs   - takes Inderal  Exercises 70 min per day .  Largely without pain  - sometimes has some L shoulder pain   Has some joint pain in toes and  knees.  Seeing a rheumatologist .  Still on Imdur ,  doing well .   Lipids in  Oct. , 2023: Chol = 101 HDL = 52 LDL = 36 Trig = 54            Past Medical History:  Diagnosis Date   Chest pain    negative stress echo in 2012   Hyperlipidemia    Hypertension     Past Surgical History:  Procedure Laterality Date   CARDIAC CATHETERIZATION     EF 65-&70% -- Normal left ventricular systolic function -- Smooth and normal coronary arteries. -- Vesta Mixer, M.D.   LEFT HEART CATH AND CORONARY ANGIOGRAPHY N/A 07/22/2020   Procedure: LEFT HEART CATH AND CORONARY ANGIOGRAPHY;  Surgeon: Lyn Records, MD;  Location: [facility] INVASIVE CV LAB;  Service: Cardiovascular;  Laterality: N/A;    Current Medications: Current Meds  Medication Sig   allopurinol (ZYLOPRIM) 100 MG tablet Take 100 mg by mouth daily.   aspirin EC 81 MG tablet Take 81 mg by mouth at bedtime. Swallow whole.   Cholecalciferol (VITAMIN D3) 125 MCG (5000 UT) CAPS Take 5,000 Units by mouth every other day. In the morning   Coenzyme Q10 (COQ10) 200 MG CAPS Take 200 mg by mouth at bedtime.   Evolocumab (REPATHA SURECLICK) 140 MG/ML SOAJ Inject 1 pen into the skin every 14 (fourteen) days.   icosapent Ethyl (VASCEPA) 1 g capsule TAKE 2 CAPSULES BY MOUTH 2 TIMES DAILY. NOT IN NETWORK   isosorbide mononitrate (IMDUR) 30 MG 24 hr tablet Take 1 tablet (30 mg total) by mouth daily.   loratadine (CLARITIN) 10 MG tablet Take 10 mg by mouth daily as needed for allergies.   losartan (COZAAR) 50 MG tablet Take 1 tablet (50 mg total) by mouth daily.   Menaquinone-7 (VITAMIN K2) 100 MCG CAPS Take 200 mcg by mouth in the morning.   metFORMIN (GLUCOPHAGE-XR) 500 MG 24 hr tablet Take 1,000 mg by mouth daily.   nitroGLYCERIN (NITROSTAT) 0.4 MG SL tablet DISSOLVE 1 TABLET UNDER THE TONGUE EVERY 5 MINUTES AS  NEEDED FOR CHEST PAIN. MAX  OF 3 TABLETS IN 15 MINUTES. CALL 911 IF PAIN PERSISTS.   omeprazole (PRILOSEC) 20 MG capsule Take 1 capsule (20 mg total) by mouth daily.   propranolol (INDERAL)  10 MG tablet TAKE 1  TABLET BY MOUTH 4 TIMES  DAILY AS NEEDED FOR PREMATURE  VENTRICULAR CONTRACTIONS   rosuvastatin (CRESTOR) 10 MG tablet Take 1 tablet (10 mg total) by mouth daily.   SAW PALMETTO-ZINC PO Take 1 capsule by mouth daily with lunch.     Allergies:   Fluticasone, Sulfa antibiotics, Sulfa drugs cross reactors, and Lipitor [atorvastatin]   Social History   Socioeconomic History   Marital status: Married    Spouse name: Not on file   Number of children: Not on file   Years of education: Not on file   Highest education level: Not on file  Occupational History   Not on file  Tobacco Use   Smoking status: Never   Smokeless tobacco: Never  Vaping Use   Vaping Use: Never used  Substance and Sexual Activity   Alcohol use: No   Drug use: No   Sexual activity: Not on file  Other Topics Concern   Not on file  Social History Narrative   Not on file   Social Determinants of Health   Financial Resource Strain: Not on file  Food Insecurity: Not on file  Transportation Needs: Not on file  Physical Activity: Not on file  Stress: Not on file  Social Connections: Not on file     Family History: The patient's family history includes Heart disease in his father.  ROS:   Please see the history of present illness.     All other systems reviewed and are negative.  EKGs/Labs/Other Studies Reviewed:    The following studies were reviewed today:   Recent Labs: 03/13/2021: ALT 22 08/02/2021: BUN 23; Creatinine, Ser 0.86; Potassium 4.4; Sodium 139  Recent Lipid Panel    Component Value Date/Time   CHOL 97 (L) 03/13/2021 1014   TRIG 65 03/13/2021 1014   HDL 50 03/13/2021 1014   CHOLHDL 1.9 03/13/2021 1014   CHOLHDL 3.7 06/28/2015 0857   VLDL 28 06/28/2015 0857   LDLCALC 33 03/13/2021 1014     Risk Assessment/Calculations:       Physical Exam:     Physical Exam: Blood pressure 124/78, pulse 60, height 6\' 1"  (1.854 m), weight 194 lb 9.6 oz (88.3 kg), SpO2 97 %.     GEN:  Well  nourished, well developed in no acute distress HEENT: Normal NECK: No JVD; No carotid bruits LYMPHATICS: No lymphadenopathy CARDIAC: RRR , no murmurs, rubs, gallops RESPIRATORY:  Clear to auscultation without rales, wheezing or rhonchi  ABDOMEN: Soft, non-tender, non-distended MUSCULOSKELETAL:  No edema; No deformity  SKIN: Warm and dry NEUROLOGIC:  Alert and oriented x 3   EKG:     ASSESSMENT:    1. Coronary artery disease involving native coronary artery of native heart with angina pectoris ([place])   2. Primary hypertension   3. Hyperlipidemia, unspecified hyperlipidemia type   4. Unstable angina (HCC)   5. Palpitations       PLAN:     Unstable angina:   Pt continues to have unusual CP / palpitations.   Has known mod - severe LAD disease .  Will get a ETT myoview study   2.  Hyperlipidemia:   lipids look great    3.   Mild aortic dilatation:  Anticipate repeat study in mid 2024          Medication Adjustments/Labs and Tests Ordered: Current medicines are reviewed at length with the patient today.  Concerns regarding medicines are outlined above.  Orders Placed This  Encounter  Procedures   MYOCARDIAL PERFUSION IMAGING   LONG TERM MONITOR (3-14 DAYS)     No orders of the defined types were placed in this encounter.     Patient Instructions  Medication Instructions:  Your physician recommends that you continue on your current medications as directed. Please refer to the Current Medication list given to you today.  *If you need a refill on your cardiac medications before your next appointment, please call your pharmacy*  Lab Work: NONE If you have labs (blood work) drawn today and your tests are completely normal, you will receive your results only by: [place] (if you have MyChart) OR A paper copy in the mail If you have any lab test that is abnormal or we need to change your treatment, we will call you to review the  results.  Testing/Procedures: Exercise Myoview Stress Test Your physician has requested that you have en exercise stress myoview. For further information please visit [email or web address]. Please follow instruction sheet, as given.  14-day Zio Monitor Your physician has recommended that you wear an event monitor. Event monitors are medical devices that record the heart's electrical activity. Doctors most often [place] these monitors to diagnose arrhythmias. Arrhythmias are problems with the speed or rhythm of the heartbeat. The monitor is a small, portable device. You can wear one while you do your normal daily activities. This is usually used to diagnose what is causing palpitations/syncope (passing out).  Follow-Up: At [facility], you and your health needs are our priority.  As part of our continuing mission to provide you with exceptional heart care, we have created designated Provider Care Teams.  These Care Teams include your primary Cardiologist (physician) and Advanced Practice Providers (APPs -  Physician Assistants and Nurse Practitioners) who all work together to provide you with the care you need, when you need it.  Your next appointment:   6 month(s)  The format for your next appointment:   In Person  Provider:   Mertie Moores, MD       Important Information About Sugar         Signed, Mertie Moores, MD  12/04/2021 9:41 AM    [place]

## 2021-12-04 ENCOUNTER — Encounter: Payer: Self-pay | Admitting: Cardiovascular Disease

## 2021-12-04 ENCOUNTER — Ambulatory Visit: Payer: Medicare Other | Attending: Cardiovascular Disease | Admitting: Cardiovascular Disease

## 2021-12-04 ENCOUNTER — Ambulatory Visit (INDEPENDENT_AMBULATORY_CARE_PROVIDER_SITE_OTHER): Payer: Medicare Other

## 2021-12-04 VITALS — BP 124/78 | HR 60 | Ht 73.0 in | Wt 194.6 lb

## 2021-12-04 DIAGNOSIS — I25119 Atherosclerotic heart disease of native coronary artery with unspecified angina pectoris: Secondary | ICD-10-CM

## 2021-12-04 DIAGNOSIS — I200 Unstable angina: Secondary | ICD-10-CM

## 2021-12-04 DIAGNOSIS — R002 Palpitations: Secondary | ICD-10-CM

## 2021-12-04 DIAGNOSIS — I10 Essential (primary) hypertension: Secondary | ICD-10-CM | POA: Diagnosis not present

## 2021-12-04 DIAGNOSIS — E785 Hyperlipidemia, unspecified: Secondary | ICD-10-CM

## 2021-12-04 NOTE — Patient Instructions (Signed)
Medication Instructions:  Your physician recommends that you continue on your current medications as directed. Please refer to the Current Medication list given to you today.  *If you need a refill on your cardiac medications before your next appointment, please call your pharmacy*  Lab Work: NONE If you have labs (blood work) drawn today and your tests are completely normal, you will receive your results only by: MyChart Message (if you have MyChart) OR A paper copy in the mail If you have any lab test that is abnormal or we need to change your treatment, we will call you to review the results.  Testing/Procedures: Exercise Myoview Stress Test Your physician has requested that you have en exercise stress myoview. For further information please visit [email or web address] Please follow instruction sheet, as given.  14-day Zio Monitor Your physician has recommended that you wear an event monitor. Event monitors are medical devices that record the heart's electrical activity. Doctors most often [place] these monitors to diagnose arrhythmias. Arrhythmias are problems with the speed or rhythm of the heartbeat. The monitor is a small, portable device. You can wear one while you do your normal daily activities. This is usually used to diagnose what is causing palpitations/syncope (passing out).  Follow-Up: At [facility], you and your health needs are our priority.  As part of our continuing mission to provide you with exceptional heart care, we have created designated Provider Care Teams.  These Care Teams include your primary Cardiologist (physician) and Advanced Practice Providers (APPs -  Physician Assistants and Nurse Practitioners) who all work together to provide you with the care you need, when you need it.  Your next appointment:   6 month(s)  The format for your next appointment:   In Person  Provider:   [name], MD       Important Information About Sugar

## 2021-12-04 NOTE — Progress Notes (Unsigned)
Enrolled for [employer] to mail a ZIO XT long term holter monitor to the patients address on file.  

## 2021-12-07 ENCOUNTER — Telehealth (HOSPITAL_COMMUNITY): Payer: Self-pay | Admitting: *Deleted

## 2021-12-07 NOTE — Telephone Encounter (Signed)
Patient given detailed instructions per Myocardial Perfusion Study Information Sheet for the test on 12/11/2021 at 10:00. Patient notified to arrive 15 minutes early and that it is imperative to arrive on time for appointment to keep from having the test rescheduled.  If you need to cancel or reschedule your appointment, please call the office within 24 hours of your appointment. . Patient verbalized understanding.Daneil Dolin

## 2021-12-11 ENCOUNTER — Encounter: Payer: Self-pay | Admitting: Cardiovascular Disease

## 2021-12-11 ENCOUNTER — Ambulatory Visit (HOSPITAL_COMMUNITY): Payer: Medicare Other | Attending: Internal Medicine

## 2021-12-11 DIAGNOSIS — R002 Palpitations: Secondary | ICD-10-CM | POA: Diagnosis present

## 2021-12-11 DIAGNOSIS — I10 Essential (primary) hypertension: Secondary | ICD-10-CM

## 2021-12-11 DIAGNOSIS — I25119 Atherosclerotic heart disease of native coronary artery with unspecified angina pectoris: Secondary | ICD-10-CM | POA: Diagnosis not present

## 2021-12-11 DIAGNOSIS — E785 Hyperlipidemia, unspecified: Secondary | ICD-10-CM

## 2021-12-11 DIAGNOSIS — I200 Unstable angina: Secondary | ICD-10-CM | POA: Insufficient documentation

## 2021-12-11 LAB — MYOCARDIAL PERFUSION IMAGING
Angina Index: 0
Duke Treadmill Score: 9
Estimated workload: 10.1
Exercise duration (min): 9 min
LV dias vol: 84 mL (ref 62–150)
LV sys vol: 45 mL
MPHR: 155 {beats}/min
Nuc Stress EF: 47 %
Peak HR: 179 {beats}/min
Percent HR: 116 %
Rest HR: 70 {beats}/min
Rest Nuclear Isotope Dose: 10.1 mCi
SDS: 0
SRS: 0
SSS: 0
ST Depression (mm): 0 mm
Stress Nuclear Isotope Dose: 32.3 mCi
TID: 1.03

## 2021-12-11 MED ORDER — TECHNETIUM TC 99M TETROFOSMIN IV KIT
10.1000 | PACK | Freq: Once | INTRAVENOUS | Status: AC | PRN
  Administered 2021-12-11: 10.1 via INTRAVENOUS

## 2021-12-11 MED ORDER — TECHNETIUM TC 99M TETROFOSMIN IV KIT
32.3000 | PACK | Freq: Once | INTRAVENOUS | Status: AC | PRN
  Administered 2021-12-11: 32.3 via INTRAVENOUS

## 2022-01-09 ENCOUNTER — Encounter: Payer: Self-pay | Admitting: Cardiovascular Disease

## 2022-01-15 ENCOUNTER — Other Ambulatory Visit: Payer: Self-pay | Admitting: Cardiovascular Disease

## 2022-01-31 ENCOUNTER — Encounter: Payer: Self-pay | Admitting: Cardiovascular Disease

## 2022-02-01 MED ORDER — NITROGLYCERIN 0.4 MG SL SUBL: SUBLINGUAL_TABLET | SUBLINGUAL | 3 refills | Status: AC

## 2022-02-01 MED ORDER — PROPRANOLOL HCL 10 MG PO TABS: ORAL_TABLET | ORAL | 1 refills | Status: AC

## 2022-03-02 ENCOUNTER — Ambulatory Visit (HOSPITAL_COMMUNITY): Payer: Medicare Other | Attending: Cardiology

## 2022-03-02 DIAGNOSIS — I77819 Aortic ectasia, unspecified site: Secondary | ICD-10-CM | POA: Diagnosis present

## 2022-03-02 DIAGNOSIS — I25709 Atherosclerosis of coronary artery bypass graft(s), unspecified, with unspecified angina pectoris: Secondary | ICD-10-CM | POA: Diagnosis present

## 2022-03-02 LAB — ECHOCARDIOGRAM COMPLETE
Area-P 1/2: 4.49 cm2
S' Lateral: 3.8 cm

## 2022-03-03 ENCOUNTER — Other Ambulatory Visit: Payer: Self-pay | Admitting: Cardiovascular Disease

## 2022-03-05 MED ORDER — ISOSORBIDE MONONITRATE ER 30 MG PO TB24: 30.0000 mg | ORAL_TABLET | Freq: Every day | ORAL | 3 refills | Status: DC

## 2022-03-05 NOTE — Addendum Note (Signed)
Addended by: Carter Kitten D on: 03/05/2022 03:58 PM   Modules accepted: Orders

## 2022-03-08 ENCOUNTER — Other Ambulatory Visit: Payer: Self-pay | Admitting: Cardiovascular Disease

## 2022-03-13 ENCOUNTER — Ambulatory Visit: Payer: Medicare Other | Admitting: Cardiovascular Disease

## 2022-03-22 NOTE — Progress Notes (Signed)
Cardiology Office Note:    Date:  03/23/2022   ID:  Bradley Dean, DOB August 07, 1955, MRN [id number]  PCP:  Lawerance Cruel, MD   [place] [facility] Cardiologist:  Mertie Moores, MD     Referring MD: Lawerance Cruel, MD   Chief Complaint  Patient presents with   Coronary Artery Disease        Hyperlipidemia    May 11, 2020   Bradley Dean is a 67 y.o. male with a hx of HTN, HLD I last saw Erich in 2012. He has been seeing Truitt Merle, NP Family hx of CAD .  Had a remote cath in 2004  Works for [employer]. No CP or dyspnea  Does a fair amount of cycling without any difficulty  Has some orthostatic hypotension  Has had borderline hyperglycemia   BP has been well controlled.  . Has a + family hx of CAD Father died in his 82s of MI Brother died in his 53 s   Did not tolerate atorvastatin ( muscle aches)  Helped with Co-Q 10  Tolerates a low dose  rosuvastatin    July 19, 2020:  Bradley Dean is seen today for follow up of his HLD and coronary artery calcification  Seen with wife, Arbutus Ped .  Coronary calcium score of 1646. This was 21 percentile for age-, race-, and sex-matched controls. We started ASA 81 mg a day  Lexiscan myoview showed no ischemia.  Normal LV function  He is here today to discuss   With exercise, ( biking) ,  gets PVCs when his HR is 130-140, will have PVCs  - several seconds of arrhythmia  Also when he is walking  Also has chest tightness , indigestion like sensation with exertion  No other GI issues   Sept. 6, 2022:  Seen with wife,  Bradley Dean is seen today for follow up visit for his CAD and a recent ER visit .  Cath on July 19, 2020 shows significant proximal -mid LAD calcification.   Has mid LAD of 65% , ostial diag is 75%. He presented to the ER on Aug. 11. Troponins were negative . CP was thought to be MSK pain  We added Imdur 30 mg a day .   Naprosyn was added.  Has not had any recurrent left arm pain   Headaches are better Has not had any specific CP  Has had some rib and back soreness Is having some PVC like symptoms  Rode his bike yesterday - felt well after he warmed up    Has a question about his bill from a recent cath   March 13, 2021  Bradley Dean  is seen today for follow up of his CAD Had a cath in July , 2022   Has mid LAD stenosis of 65%, D1 75% Was treated medically with imdur   No recent episodes of CP  No recent ER visits  Glucose and HbA1C has been going up - ? 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   June 06, 2021: Bradley Dean is seen today for follow up visit  For his CAD, Mildly dilated ascending aorta. CTA shows asc. Aorta size of 4.0  cm. Has normal LV function.  1 week ago ( May 29)  , went to the beach. Family stress On memorial day , walked ~ 6 miles ( 20,000 steps)  Did not have any issues while exercising  Has noted some intrascapular pain with exercise  But also has noted some stiffness of his spine Ate some spicy food, woke up with abd discomfort, some chest pain , sweats .  Took a SL NTG  - did not help the discomfort  30 min later, the pain gradually resolved,   no residual pain   Will get a Troponin T today   Dec. 4, 2023 Bradley Dean is seen today for follow up of his CAD, Had CP worrisome fo UAP in June Troponin was normal  CTA of the aorta showed a stable 4.0 cm asc. Aorta  needs follow up CTA of aorta next year    Has had some palpitations - clinically sound like PVCs  He has known PVCs   - takes Inderal  Exercises 70 min per day .  Largely without pain  - sometimes has some L shoulder pain   Has some joint pain in toes and  knees.  Seeing a rheumatologist .  Still on Imdur ,  doing well .   Lipids in  Oct. , 2023: Chol = 101 HDL = 52 LDL = 36 Trig = 54   March 23, 2022 Bradley Dean is seen for follow up of his CAD  Has been having some angina symptoms  ETT myoview :  walked for 9 min, had some dyspnea No ischemia , mildly reduced LVEF of 47%  Echo shows low normal EF of 50-55%. 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 -- Thayer Headings, M.D.   LEFT HEART CATH AND CORONARY ANGIOGRAPHY N/A 07/22/2020   Procedure: LEFT HEART CATH AND CORONARY ANGIOGRAPHY;  Surgeon: Belva Crome, MD;  Location: [facility];  Service: Cardiovascular;  Laterality: N/A;    Current Medications: Current Meds  Medication Sig   allopurinol (ZYLOPRIM) 100 MG tablet Take 100 mg by mouth daily.   aspirin EC 81 MG tablet Take 81 mg by mouth at bedtime. Swallow whole.   Cholecalciferol (VITAMIN D3) 125 MCG (5000 UT) CAPS Take 5,000 Units by mouth every other day. In the morning. Per patient taking 2,000 units   Coenzyme Q10 (COQ10) 200 MG CAPS Take 200 mg by mouth at bedtime.   colchicine 0.6 MG tablet Take 0.6 mg by mouth daily.   icosapent Ethyl (VASCEPA) 1 g capsule TAKE 2 CAPSULES BY MOUTH 2 TIMES DAILY. NOT IN NETWORK (Patient taking differently: Per patient taking 1gm  twice a day)   isosorbide mononitrate (IMDUR) 30 MG 24 hr tablet Take 1 tablet (30 mg  total) by mouth daily.   loratadine (CLARITIN)  10 MG tablet Take 10 mg by mouth daily as needed for allergies.   losartan (COZAAR) 50 MG tablet Take 1 tablet (50 mg total) by mouth daily.   Menaquinone-7 (VITAMIN K2) 100 MCG CAPS Take 200 mcg by mouth in the morning.   metFORMIN (GLUCOPHAGE-XR) 500 MG 24 hr tablet Take 1,000 mg by mouth daily.   nitroGLYCERIN (NITROSTAT) 0.4 MG SL tablet DISSOLVE 1 TABLET UNDER THE TONGUE EVERY 5 MINUTES AS  NEEDED FOR CHEST PAIN. MAX  OF 3 TABLETS IN 15 MINUTES. CALL 911 IF PAIN PERSISTS.   omeprazole (PRILOSEC) 20 MG capsule Take 1 capsule (20 mg total) by mouth daily.   propranolol (INDERAL) 10 MG tablet TAKE 1 TABLET BY MOUTH 4 TIMES  DAILY AS NEEDED FOR PREMATURE  VENTRICULAR CONTRACTIONS   REPATHA SURECLICK [id number] MG/ML SOAJ INJECT 1 PEN UNDER THE SKIN EVERY 14 DAYS   rosuvastatin (CRESTOR) 10 MG tablet Take 1 tablet (10 mg total) by mouth daily.   SAW PALMETTO-ZINC PO Take 1 capsule by mouth daily with lunch.     Allergies:   Fluticasone, Sulfa antibiotics, Sulfa drugs cross reactors, and Lipitor [atorvastatin]   Social History   Socioeconomic History   Marital status: Married    Spouse name: Not on file   Number of children: Not on file   Years of education: Not on file   Highest education level: Not on file  Occupational History   Not on file  Tobacco Use   Smoking status: Never   Smokeless tobacco: Never  Vaping Use   Vaping Use: Never used  Substance and Sexual Activity   Alcohol use: No   Drug use: No   Sexual activity: Not on file  Other Topics Concern   Not on file  Social History Narrative   Not on file   Social Determinants of Health   Financial Resource Strain: Not on file  Food Insecurity: Not on file  Transportation Needs: Not on file  Physical Activity: Not on file  Stress: Not on file  Social Connections: Not on file     Family History: The patient's family history includes Heart disease in his father.  ROS:   Please see the history of present illness.      All other systems reviewed and are negative.  EKGs/Labs/Other Studies Reviewed:    The following studies were reviewed today:   Recent Labs: 08/02/2021: BUN 23; Creatinine, Ser 0.86; Potassium 4.4; Sodium 139  Recent Lipid Panel    Component Value Date/Time   CHOL 97 (L) 03/13/2021 1014   TRIG 65 03/13/2021 1014   HDL 50 03/13/2021 1014   CHOLHDL 1.9 03/13/2021 1014   CHOLHDL 3.7 06/28/2015 0857   VLDL 28 06/28/2015 0857   LDLCALC 33 03/13/2021 1014     Risk Assessment/Calculations:       Physical Exam:    Physical Exam: Blood pressure 124/76, pulse 63, height 6' (1.829 m), weight 193 lb 3.2 oz (87.6 kg), SpO2 95 %.      GEN:  Well nourished, well developed in no acute distress HEENT: Normal NECK: No JVD; No carotid bruits LYMPHATICS: No lymphadenopathy CARDIAC: RRR , no murmurs, rubs, gallops RESPIRATORY:  Clear to auscultation without rales, wheezing or rhonchi  ABDOMEN: Soft, non-tender, non-distended MUSCULOSKELETAL:  No edema; No deformity  SKIN: Warm and dry NEUROLOGIC:  Alert and oriented x 3   EKG:     ASSESSMENT:    1. Aneurysm  of ascending aorta without rupture ([place])   2. Coronary artery disease involving native coronary artery of native heart with angina pectoris ([place])   3. Acquired dilation of ascending aorta and aortic root ([facility])        PLAN:     Unstable angina:   He is not having any episodes of angina.  He does have occasional episodes of right upper chest pain which resolves with belching.  Suspect this is GI issue.  2.  Hyperlipidemia:      3.   Mild aortic dilatation:   CTAs look stable.  Will anticipate getting a getting an MR angiogram next year to evaluate his mildly dilated ascending aorta.         Medication Adjustments/Labs and Tests Ordered: Current medicines are reviewed at length with the patient today.  Concerns regarding medicines are outlined above.  Orders Placed This Encounter  Procedures   MR ANGIO CHEST W  WO CONTRAST   Lipid panel   ALT   Basic metabolic panel     No orders of the defined types were placed in this encounter.     Patient Instructions  Medication Instructions:  Your physician recommends that you continue on your current medications as directed. Please refer to the Current Medication list given to you today. *If you need a refill on your cardiac medications before your next appointment, please call your pharmacy*   Lab Work: Lipids, ALT, and BMET to be done in 1 year If you have labs (blood work) drawn today and your tests are completely normal, you will receive your results only by: [place] (if you have MyChart) OR A paper copy in the mail If you have any lab test that is abnormal or we need to change your treatment, we will call you to review the results.   Testing/Procedures: MR Angiogram of Aorta   Follow-Up: At [facility], you and your health needs are our priority.  As part of our continuing mission to provide you with exceptional heart care, we have created designated Provider Care Teams.  These Care Teams include your primary Cardiologist (physician) and Advanced Practice Providers (APPs -  Physician Assistants and Nurse Practitioners) who all work together to provide you with the care you need, when you need it.  We recommend signing up for the patient portal called "MyChart".  Sign up information is provided on this After Visit Summary.  MyChart is used to connect with patients for Virtual Visits (Telemedicine).  Patients are able to view lab/test results, encounter notes, upcoming appointments, etc.  Non-urgent messages can be sent to your provider as well.   To learn more about what you can do with MyChart, go to [email or web address].    Your next appointment:   1 year(s)  Provider:   Mertie Moores, MD     Signed, Mertie Moores, MD  03/23/2022 2:06 PM    [place]

## 2022-03-23 ENCOUNTER — Encounter: Payer: Self-pay | Admitting: Cardiovascular Disease

## 2022-03-23 ENCOUNTER — Ambulatory Visit: Payer: Medicare Other | Attending: Cardiovascular Disease | Admitting: Cardiovascular Disease

## 2022-03-23 VITALS — BP 124/76 | HR 63 | Ht 72.0 in | Wt 193.2 lb

## 2022-03-23 DIAGNOSIS — I25119 Atherosclerotic heart disease of native coronary artery with unspecified angina pectoris: Secondary | ICD-10-CM | POA: Insufficient documentation

## 2022-03-23 DIAGNOSIS — I77819 Aortic ectasia, unspecified site: Secondary | ICD-10-CM | POA: Diagnosis present

## 2022-03-23 DIAGNOSIS — I7121 Aneurysm of the ascending aorta, without rupture: Secondary | ICD-10-CM

## 2022-03-23 NOTE — Patient Instructions (Signed)
Medication Instructions:  Your physician recommends that you continue on your current medications as directed. Please refer to the Current Medication list given to you today. *If you need a refill on your cardiac medications before your next appointment, please call your pharmacy*   Lab Work: Lipids, ALT, and BMET to be done in 1 year If you have labs (blood work) drawn today and your tests are completely normal, you will receive your results only by: [place] (if you have MyChart) OR A paper copy in the mail If you have any lab test that is abnormal or we need to change your treatment, we will call you to review the results.   Testing/Procedures: MR Angiogram of Aorta   Follow-Up: At [facility], you and your health needs are our priority.  As part of our continuing mission to provide you with exceptional heart care, we have created designated Provider Care Teams.  These Care Teams include your primary Cardiologist (physician) and Advanced Practice Providers (APPs -  Physician Assistants and Nurse Practitioners) who all work together to provide you with the care you need, when you need it.  We recommend signing up for the patient portal called "MyChart".  Sign up information is provided on this After Visit Summary.  MyChart is used to connect with patients for Virtual Visits (Telemedicine).  Patients are able to view lab/test results, encounter notes, upcoming appointments, etc.  Non-urgent messages can be sent to your provider as well.   To learn more about what you can do with MyChart, go to [email or web address].    Your next appointment:   1 year(s)  Provider:   Mertie Moores, MD

## 2022-04-26 ENCOUNTER — Encounter: Payer: Self-pay | Admitting: Cardiovascular Disease

## 2022-04-27 MED ORDER — LOSARTAN POTASSIUM 50 MG PO TABS: 50.0000 mg | ORAL_TABLET | Freq: Every day | ORAL | 0 refills | Status: DC

## 2022-04-27 MED ORDER — ROSUVASTATIN CALCIUM 10 MG PO TABS: 10.0000 mg | ORAL_TABLET | Freq: Every day | ORAL | 0 refills | Status: DC

## 2022-05-07 ENCOUNTER — Other Ambulatory Visit: Payer: Self-pay | Admitting: Cardiovascular Disease

## 2022-05-15 ENCOUNTER — Telehealth: Payer: Self-pay | Admitting: Cardiovascular Disease

## 2022-05-15 ENCOUNTER — Ambulatory Visit: Payer: Medicare Other | Attending: Physician Assistant | Admitting: Physician Assistant

## 2022-05-15 ENCOUNTER — Encounter: Payer: Self-pay | Admitting: Physician Assistant

## 2022-05-15 VITALS — BP 118/64 | HR 57 | Ht 73.0 in | Wt 174.8 lb

## 2022-05-15 DIAGNOSIS — I25119 Atherosclerotic heart disease of native coronary artery with unspecified angina pectoris: Secondary | ICD-10-CM | POA: Diagnosis present

## 2022-05-15 DIAGNOSIS — R079 Chest pain, unspecified: Secondary | ICD-10-CM | POA: Diagnosis present

## 2022-05-15 DIAGNOSIS — I10 Essential (primary) hypertension: Secondary | ICD-10-CM | POA: Insufficient documentation

## 2022-05-15 DIAGNOSIS — E7800 Pure hypercholesterolemia, unspecified: Secondary | ICD-10-CM | POA: Diagnosis present

## 2022-05-15 LAB — TROPONIN I (HIGH SENSITIVITY): Troponin I (High Sensitivity): 3 ng/L (ref <18)

## 2022-05-15 LAB — D-DIMER, QUANTITATIVE: D-Dimer, Quant: <0.27 ug/mL-FEU (ref 0.00–0.50)

## 2022-05-15 MED ORDER — ISOSORBIDE MONONITRATE ER 30 MG PO TB24: 45.0000 mg | ORAL_TABLET | Freq: Every day | ORAL | 3 refills | Status: DC

## 2022-05-15 NOTE — Assessment & Plan Note (Signed)
As noted, he has a history of moderate nonobstructive disease by cardiac catheterization in 2022.  Myoview in December 2023 was low risk and negative for ischemia.  He is not having exertional symptoms.  He has not really had chest pain just more arm discomfort/numbness.  Nitroglycerin may have made his symptoms better.  As noted, he will be sent to the emergency room if his troponin is elevated.  If his troponin is normal and he returns in 2 weeks with continued symptoms, we may need to consider cardiac catheterization.  Continue aspirin 81 mg daily, Repatha 140 mg every 14 days, Vascepa 2 g twice daily, nitroglycerin as needed chest pain.  Increase isosorbide mononitrate to 45 mg daily.  Follow-up 2 weeks.

## 2022-05-15 NOTE — Assessment & Plan Note (Signed)
Blood pressure is controlled.  Increase isosorbide mononitrate to 45 mg daily as an antianginal.  Continue losartan 50 mg daily.

## 2022-05-15 NOTE — Progress Notes (Signed)
Cardiology Office Note:    Date:  05/15/2022  ID:  Bradley Dean, DOB 11/03/1955, MRN [id number] PCP: Daisy Floro, MD  [facility] Cardiologist:  [name], MD          Patient Profile:   Coronary artery disease Cardiac CT 05/13/2020: CAC score 1646 (97th percentile) LHC 07/22/2020: LAD (heavily calcified) proximal 60, mid 60, D2 75; RCA proximal 40, distal 50> med Rx Myoview 12/11/2021: No ischemia or infarction, EF 47, low risk Thoracic aortic aneurysm Chest CTA 08/09/2021: Ascending thoracic aortic aneurysm 4 cm TTE 03/02/22: EF 50-55, no RWMA, normal RVSF, mild dilation of ascending aorta (40 mm), RAP 3 Supraventricular Tachycardia  Monitor 11/29/2021: Occasional episodes of SVT (30 runs) PVCs  Hypertension  Hyperlipidemia  Intol of Atorvastatin  FHx of CAD  Carotid US 06/24/2020: No significant ICA stenosis bilaterally      History of Present Illness:   Bradley Dean is a 67 y.o. male who returns for evaluation of bilateral arm symptoms and diaphoresis. He was last seen by Dr. Elease Hashimoto in 03/2022.  He returns for evaluation of 2 episodes of bilateral arm heaviness/numbness.  1 episode occurred on Saturday after doing exercises that he started with physical therapy for lower back problem.  The other episode occurred last night while lying in bed.  Both episodes lasted about 5 minutes.  He took nitroglycerin last night.  His symptoms eventually resolved.  He here mains fairly active.  He has not had exertional chest pain or shortness of breath.  He has not had syncope, orthopnea, leg edema.  Of note, he returned from [facility] in the last couple of weeks.  He flew to [facility] and then drove back to [place] from there.  Review of Systems  Gastrointestinal:  Positive for heartburn.   see HPI    Studies Reviewed:    EKG: Sinus bradycardia, HR 57, low voltage, no acute ST-T wave changes, QTc 422   Risk Assessment/Calculations:            Physical  Exam:   VS:  BP 118/64   Pulse (!) 57   Ht 6\' 1"  (1.854 m)   Wt 174 lb 12.8 oz (79.3 kg)   SpO2 97%   BMI 23.06 kg/m    Wt Readings from Last 3 Encounters:  05/15/22 174 lb 12.8 oz (79.3 kg)  03/23/22 193 lb 3.2 oz (87.6 kg)  12/11/21 194 lb (88 kg)    Constitutional:      Appearance: Healthy appearance. Not in distress.  Neck:     Vascular: JVD normal.  Pulmonary:     Breath sounds: Normal breath sounds. No wheezing. No rales.  Cardiovascular:     Normal rate. Regular rhythm. Normal S1. Normal S2.      Murmurs: There is no murmur.  Edema:    Peripheral edema absent.  Abdominal:     Palpations: Abdomen is soft.       ASSESSMENT AND PLAN:   Chest pain He presents mainly with bilateral arm heaviness/numbness.  This happened on 2 occasions.  Nitroglycerin may have made his symptoms better.  He does have a history of moderate nonobstructive disease by cardiac catheterization in 2022.  He just had a stress test less than 5 months ago that was low risk.  His EF was low normal on recent echocardiogram in March.  His electrocardiogram does not demonstrate any acute changes.  He just returned from a long car trip.  However,  he is not tachycardic and his oxygen saturation is normal.  His presentation would be quite atypical for acute coronary syndrome or acute pulmonary embolism.  However, I have recommended proceeding with a stat troponin and stat D-dimer.  If these are normal, he will not require further workup.  If his troponin is elevated, he will need to go to the emergency room for admission and possible cardiac catheterization.  If his D-dimer is elevated, he will need a CT scan to rule out pulmonary embolism.  I will bring him back in 2 weeks for close follow-up.  CAD (coronary artery disease) As noted, he has a history of moderate nonobstructive disease by cardiac catheterization in 2022.  Myoview in December 2023 was low risk and negative for ischemia.  He is not having exertional  symptoms.  He has not really had chest pain just more arm discomfort/numbness.  Nitroglycerin may have made his symptoms better.  As noted, he will be sent to the emergency room if his troponin is elevated.  If his troponin is normal and he returns in 2 weeks with continued symptoms, we may need to consider cardiac catheterization.  Continue aspirin 81 mg daily, Repatha 140 mg every 14 days, Vascepa 2 g twice daily, nitroglycerin as needed chest pain.  Increase isosorbide mononitrate to 45 mg daily.  Follow-up 2 weeks.  Hypertension Blood pressure is controlled.  Increase isosorbide mononitrate to 45 mg daily as an antianginal.  Continue losartan 50 mg daily.  Hyperlipidemia Labs from primary care reviewed via [facility].  LDL in October 2023 was optimal at 36.  Continue Repatha 140 mg every 14 days, Vascepa 2 g twice daily.     Dispo:  Return in about 2 weeks (around 05/29/2022) for Close Follow Up, w/ Dr. Elease Hashimoto, or [name], PA-C.  Signed, [name], PA-C

## 2022-05-15 NOTE — Telephone Encounter (Signed)
Pt is requesting a callback regarding him having an unusual event  happen yesterday evening. He stated both his arm went numb and it could be related to his physical therapy but he's wondering if he needs labs done today as well. Please advise.

## 2022-05-15 NOTE — Patient Instructions (Signed)
Medication Instructions:  Your physician has recommended you make the following change in your medication:   INCREASE the Isosorbide (Imdur) to 30 mg taking 1 1/2 tablet daily   *If you need a refill on your cardiac medications before your next appointment, please call your pharmacy*   Lab Work: TODAY:  GO TO [place] FOR STAT:  TROPONIN & DDIMER  If you have labs (blood work) drawn today and your tests are completely normal, you will receive your results only by: MyChart Message (if you have MyChart) OR A paper copy in the mail If you have any lab test that is abnormal or we need to change your treatment, we will call you to review the results.   Testing/Procedures: None ordered   Follow-Up: At [facility], you and your health needs are our priority.  As part of our continuing mission to provide you with exceptional heart care, we have created designated Provider Care Teams.  These Care Teams include your primary Cardiologist (physician) and Advanced Practice Providers (APPs -  Physician Assistants and Nurse Practitioners) who all work together to provide you with the care you need, when you need it.  We recommend signing up for the patient portal called "MyChart".  Sign up information is provided on this After Visit Summary.  MyChart is used to connect with patients for Virtual Visits (Telemedicine).  Patients are able to view lab/test results, encounter notes, upcoming appointments, etc.  Non-urgent messages can be sent to your provider as well.   To learn more about what you can do with MyChart, go to [email or web address].    Your next appointment:   2 week(s)  Provider:   [name], MD     Other Instructions

## 2022-05-15 NOTE — Telephone Encounter (Signed)
Pt called reporting having episodes numbness BUE . Pt he is currently going to PT for lower back pain, this past Saturday while he was doing some lower back exercises he felt a weird sensation with both of his arms " felt like my arms went to sleep", this last for a brief moment. Last night pt stated after completing his lower back exercises, he took a shower, and was laying down listening to music. He started to experience the same symptoms, both arms felt numb and began to sweat. Pt did take a nitro and the symptoms did subside. Pt did not seek medical attention. He took his bp while on the phone 140/80, HR 60, pt stated he still is a little sleepy but stated he did not sleep well last night. Pt requested appointment with MD, explained ED precautions, scheduled with APP 5/14 and pt voiced understanding.

## 2022-05-15 NOTE — Assessment & Plan Note (Signed)
Labs from primary care reviewed via [facility].  LDL in October 2023 was optimal at 36.  Continue Repatha 140 mg every 14 days, Vascepa 2 g twice daily.

## 2022-05-15 NOTE — Assessment & Plan Note (Signed)
He presents mainly with bilateral arm heaviness/numbness.  This happened on 2 occasions.  Nitroglycerin may have made his symptoms better.  He does have a history of moderate nonobstructive disease by cardiac catheterization in 2022.  He just had a stress test less than 5 months ago that was low risk.  His EF was low normal on recent echocardiogram in March.  His electrocardiogram does not demonstrate any acute changes.  He just returned from a long car trip.  However, he is not tachycardic and his oxygen saturation is normal.  His presentation would be quite atypical for acute coronary syndrome or acute pulmonary embolism.  However, I have recommended proceeding with a stat troponin and stat D-dimer.  If these are normal, he will not require further workup.  If his troponin is elevated, he will need to go to the emergency room for admission and possible cardiac catheterization.  If his D-dimer is elevated, he will need a CT scan to rule out pulmonary embolism.  I will bring him back in 2 weeks for close follow-up.

## 2022-05-15 NOTE — Telephone Encounter (Signed)
See my note from 05/15/2022. Meaghan Whistler, PA-C    05/15/2022 6:06 PM   

## 2022-05-17 NOTE — Telephone Encounter (Signed)
Phil Can you review his recent note here and my recent OV? Symptoms sound neurogenic to me. He takes NTG with ?relief. He has mod nonobs CAD w heavy LAD calcification by cath in 2022 and low risk Myoview in 12/2021. Trop and DDimer were both neg when I saw him earlier this week. I wanted to get your opinion on offering cardiac catheterization w FFR? Jac Romulus

## 2022-05-29 NOTE — Telephone Encounter (Signed)
Cancel appt this week. Reschedule with Dr. Elease Hashimoto in 3-4 mos. [name], PA-C    05/29/2022 8:07 AM

## 2022-05-31 ENCOUNTER — Ambulatory Visit: Payer: Medicare Other | Admitting: Cardiovascular Disease

## 2022-06-11 ENCOUNTER — Other Ambulatory Visit: Payer: Self-pay | Admitting: Cardiovascular Disease

## 2022-06-11 ENCOUNTER — Encounter: Payer: Self-pay | Admitting: Cardiovascular Disease

## 2022-06-13 ENCOUNTER — Ambulatory Visit: Payer: Medicare Other | Admitting: Cardiovascular Disease

## 2022-08-05 ENCOUNTER — Encounter: Payer: Self-pay | Admitting: Cardiovascular Disease

## 2022-08-05 NOTE — Progress Notes (Unsigned)
Cardiology Office Note:    Date:  08/06/2022   ID:  Bradley Dean, DOB April 12, 1955, MRN [id number]  PCP:  Bradley Floro, MD   [facility] Providers Cardiologist:  Bradley Miss, MD     Referring MD: Bradley Floro, MD   Chief Complaint  Patient presents with   Coronary Artery Disease   Hyperlipidemia    Bradley Dean, Bradley   Bradley Dean is a 67 y.o. male with a hx of HTN, HLD I last saw Bradley Dean in 2012. He has been seeing Bradley Fredrickson, NP Family hx of CAD .  Had a remote cath in 2004  Works for [employer]. No CP or dyspnea  Does a fair amount of cycling without any difficulty  Has some orthostatic hypotension  Has had borderline hyperglycemia   BP has been well controlled.  . Has a + family hx of CAD Father died in his 60s of MI Brother died in his 31 s   Did not tolerate atorvastatin ( muscle aches)  Helped with Co-Q 10  Tolerates a low dose  rosuvastatin    July 19, Bradley:  Add is seen today for follow up of his HLD and coronary artery calcification  Seen with wife, Bradley Dean .  Coronary calcium score of 1646. This was 25 percentile for age-, race-, and sex-matched controls. We started ASA 81 mg a day  Lexiscan myoview showed no ischemia.  Normal LV function  He is here today to discuss   With exercise, ( biking) ,  gets PVCs when his HR is 130-140, will have PVCs  - several seconds of arrhythmia  Also when he is walking  Also has chest tightness , indigestion like sensation with exertion  No other GI issues   Sept. 6, Bradley:  Seen with wife,  Bradley Dean is seen today for follow up visit for his CAD and a recent ER visit .  Cath on July 19, Bradley shows significant proximal -mid LAD calcification.   Has mid LAD of 65% , ostial diag is 75%. He presented to the ER on Aug. Dean. Troponins were negative . CP was thought to be MSK pain  We added Imdur 30 mg a day .   Naprosyn was added.  Has not had any recurrent left arm pain  Headaches  are better Has not had any specific CP  Has had some rib and back soreness Is having some PVC like symptoms  Rode his bike yesterday - felt well after he warmed up    Has a question about his bill from a recent cath   March 13, 2021  Bradley Dean  is seen today for follow up of his CAD Had a cath in July , Bradley   Has mid LAD stenosis of 65%, D1 75% Was treated medically with imdur   No recent episodes of CP  No recent ER visits  Glucose and HbA1C has been going up - ? 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   June 06, 2021: Bradley Dean is seen today for follow up visit  For his CAD, Mildly dilated ascending aorta. CTA shows asc. Aorta size of 4.0 cm. Has normal LV function.  1 week ago ( Bradley 30)  , went to the beach. Family stress On memorial day , walked ~ 6 miles ( 20,000 steps)  Did not have any issues while exercising  Has noted some intrascapular pain with exercise  But also has noted some stiffness of his spine Ate some spicy food, woke up with abd discomfort, some chest pain , sweats .  Took a SL NTG  - did not help the discomfort  30 min later, the pain gradually resolved,   no residual pain   Will get a Troponin T today   Dec. 4, 2023 Bradley Dean is seen today for follow up of his CAD, Had CP worrisome fo UAP in June Troponin was normal  CTA of the aorta showed a stable 4.0 cm asc. Aorta  needs follow up CTA of aorta next year    Has had some palpitations - clinically sound like PVCs  He has known PVCs   - takes Inderal  Exercises 70 min per day .  Largely without pain  - sometimes has some L shoulder pain   Has some joint pain in toes and  knees.  Seeing a rheumatologist .  Still on Imdur ,  doing well .   Lipids in Oct. ,  2023: Chol = 101 HDL = 52 LDL = 36 Trig = 54   March 23, 2022 Bradley Dean is seen for follow up of his CAD  Has been having some angina symptoms  ETT myoview :  walked for 9 min, had some dyspnea No ischemia , mildly reduced LVEF of 47%  Echo shows low normal EF of 50-55%. 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  He has a history of coronary artery disease, mildly dilated ascending aorta.  Hyperlipidemia.  Has had several "events" associated with increased HR  Typicaly after he is out working in the sun, perhaps dehydrated  The HR increased seem to be immediate   He has had episodes of SVT on event monitor Has achieved Hr of 190  HR is slow at baseline.  I do not think he would tolerate scheduled beta blocker   I taught him Valsalva maneuver and cold water reflex which he Bradley be able to use if he has sustained episodes of SVT.     Past Medical History:  Diagnosis Date   Chest pain    negative stress echo in 2012   Hyperlipidemia    Hypertension     Past Surgical History:  Procedure Laterality Date   CARDIAC CATHETERIZATION     EF 65-&70% -- Normal left ventricular systolic function -- Smooth and normal coronary arteries. -- Bradley Dean, M.D.   LEFT HEART CATH AND CORONARY ANGIOGRAPHY N/A 7/22/Bradley   Procedure: LEFT HEART CATH AND CORONARY ANGIOGRAPHY;  Surgeon: Bradley Records, MD;  Location: [facility] INVASIVE CV LAB;  Service: Cardiovascular;  Laterality: N/A;    Current Medications: Current Meds  Medication Sig   allopurinol (ZYLOPRIM) 100 MG tablet Take 100 mg by mouth daily.   aspirin EC 81 MG tablet Take 81 mg by mouth at  bedtime. Swallow whole.   Cholecalciferol (VITAMIN D3) 125 MCG (5000 UT) CAPS Per patient taking 2,000 units by mouth every morning   Coenzyme Q10 (COQ10) 200 MG CAPS Take 200 mg by mouth at bedtime.   colchicine 0.6 MG tablet Take 0.6 mg by mouth as needed (for gout flare up).   icosapent Ethyl (VASCEPA) 1 g capsule TAKE 2 CAPSULES BY MOUTH 2 TIMES DAILY. NOT IN NETWORK   isosorbide mononitrate (IMDUR) 30 MG 24 hr tablet Take 1.5 tablets (45 mg total) by mouth daily.   loratadine (CLARITIN) 10 MG tablet Take 10 mg by mouth daily as needed for allergies.   losartan (COZAAR) 50 MG tablet TAKE 1 TABLET BY MOUTH DAILY   Menaquinone-7 (VITAMIN K2) 100 MCG CAPS Take 200 mcg by mouth in the morning.   metFORMIN (GLUCOPHAGE-XR) 500 MG 24 hr tablet Take 1,000 mg by mouth daily.   nitroGLYCERIN (NITROSTAT) 0.4 MG SL tablet DISSOLVE 1 TABLET UNDER THE TONGUE EVERY 5 MINUTES AS  NEEDED FOR CHEST PAIN. MAX  OF 3 TABLETS IN 15 MINUTES. CALL 911 IF PAIN PERSISTS.   propranolol (INDERAL) 10 MG tablet TAKE 1 TABLET BY MOUTH 4 TIMES  DAILY AS NEEDED FOR PREMATURE  VENTRICULAR CONTRACTIONS   REPATHA SURECLICK 140 MG/ML SOAJ INJECT 1 PEN UNDER THE SKIN EVERY 14 DAYS   rosuvastatin (CRESTOR) 10 MG tablet TAKE 1 TABLET BY MOUTH DAILY     Allergies:   Fluticasone, Sulfa antibiotics, Sulfa drugs cross reactors, and Lipitor [atorvastatin]   Social History   Socioeconomic History   Marital status: Married    Spouse name: Not on file   Number of children: Not on file   Years of education: Not on file   Highest education level: Not on file  Occupational History   Not on file  Tobacco Use   Smoking status: Never   Smokeless tobacco: Never  Vaping Use   Vaping status: Never Used  Substance and Sexual Activity   Alcohol use: No   Drug use: No   Sexual activity: Not on file  Other Topics Concern   Not on file  Social History Narrative   Not on file   Social Determinants of Health   Financial Resource  Strain: Not on file  Food Insecurity: Not on file  Transportation Needs: Not on file  Physical Activity: Not on file  Stress: Not on file  Social Connections: Not on file     Family History: The patient's family history includes Heart disease in his father.  ROS:   Please see the history of present illness.     All other systems reviewed and are negative.  EKGs/Labs/Other Studies Reviewed:    The following studies were reviewed today:   Recent Labs: No results found for requested labs within last 365 days.  Recent Lipid Panel    Component Value Date/Time   CHOL 97 (L) 03/13/2021 1014   TRIG 65 03/13/2021 1014   HDL 50 03/13/2021 1014   CHOLHDL 1.9 03/13/2021 1014   CHOLHDL 3.7 06/28/2015 0857   VLDL 28 06/28/2015 0857   LDLCALC 33 03/13/2021 1014     Risk Assessment/Calculations:       Physical Exam:    Physical Exam: Blood pressure 100/64, pulse (!) 51, height 6\' 1"  (1.854 m), weight 195 lb 9.6 oz (88.7 kg), SpO2 95%.       GEN:  Well nourished, well developed in no acute distress HEENT: Normal NECK: No JVD; No carotid bruits LYMPHATICS: No  lymphadenopathy CARDIAC: RRR , no murmurs, rubs, gallops RESPIRATORY:  Clear to auscultation without rales, wheezing or rhonchi  ABDOMEN: Soft, non-tender, non-distended MUSCULOSKELETAL:  No edema; No deformity  SKIN: Warm and dry NEUROLOGIC:  Alert and oriented x 3    EKG:     ASSESSMENT:    1. Pure hypercholesterolemia   2. SVT (supraventricular tachycardia)   3. Aortic dilatation (HCC)         PLAN:     Unstable angina:   Bradley Dean has  not had any further episodes of chest discomfort.   2.  Hyperlipidemia:   Lipid levels overall look good.   3.   Mild aortic dilatation:  Bradley Dean has minimal dilatation of his ascending aorta.  Will repeat CTA or MRA next year  4.  Supraventricular tachycardia: Bradley Dean is  having episodes of SVT that seem to be self-limited.   We discussed the Valsalva maneuver and  also splashing cold water on his face.  At present these appear to be fairly self-limited and I do not think that he will need to have an ablation although he knows that this is an option.          Medication Adjustments/Labs and Tests Ordered: Current medicines are reviewed at length with the patient today.  Concerns regarding medicines are outlined above.  No orders of the defined types were placed in this encounter.    No orders of the defined types were placed in this encounter.     Patient Instructions  Medication Instructions:  Your physician recommends that you continue on your current medications as directed. Please refer to the Current Medication list given to you today.  *If you need a refill on your cardiac medications before your next appointment, please call your pharmacy*   Lab Work: NONE If you have labs (blood work) drawn today and your tests are completely normal, you will receive your results only by: MyChart Message (if you have MyChart) OR A paper copy in the mail If you have any lab test that is abnormal or we need to change your treatment, we will call you to review the results.   Testing/Procedures: NONE   Follow-Up: At [facility], you and your health needs are our priority.  As part of our continuing mission to provide you with exceptional heart care, we have created designated Provider Care Teams.  These Care Teams include your primary Cardiologist (physician) and Advanced Practice Providers (APPs -  Physician Assistants and Nurse Practitioners) who all work together to provide you with the care you need, when you need it.  We recommend signing up for the patient portal called "MyChart".  Sign up information is provided on this After Visit Summary.  MyChart is used to connect with patients for Virtual Visits (Telemedicine).  Patients are able to view lab/test results, encounter notes, upcoming appointments, etc.  Non-urgent messages can be  sent to your provider as well.   To learn more about what you can do with MyChart, go to [email or web address].    Your next appointment:   1 year(s)  Provider:   [name], MD        Signed, Bradley Miss, MD  08/06/2022 6:10 PM    [facility]

## 2022-08-06 ENCOUNTER — Ambulatory Visit: Payer: Medicare Other | Attending: Cardiovascular Disease | Admitting: Cardiovascular Disease

## 2022-08-06 ENCOUNTER — Encounter: Payer: Self-pay | Admitting: Cardiovascular Disease

## 2022-08-06 VITALS — BP 100/64 | HR 51 | Ht 73.0 in | Wt 195.6 lb

## 2022-08-06 DIAGNOSIS — E7800 Pure hypercholesterolemia, unspecified: Secondary | ICD-10-CM | POA: Diagnosis present

## 2022-08-06 DIAGNOSIS — I471 Supraventricular tachycardia, unspecified: Secondary | ICD-10-CM

## 2022-08-06 DIAGNOSIS — I77819 Aortic ectasia, unspecified site: Secondary | ICD-10-CM | POA: Diagnosis present

## 2022-08-06 NOTE — Patient Instructions (Signed)
Medication Instructions:  Your physician recommends that you continue on your current medications as directed. Please refer to the Current Medication list given to you today.  *If you need a refill on your cardiac medications before your next appointment, please call your pharmacy*   Lab Work: NONE If you have labs (blood work) drawn today and your tests are completely normal, you will receive your results only by: MyChart Message (if you have MyChart) OR A paper copy in the mail If you have any lab test that is abnormal or we need to change your treatment, we will call you to review the results.   Testing/Procedures: NONE   Follow-Up: At [facility], you and your health needs are our priority.  As part of our continuing mission to provide you with exceptional heart care, we have created designated Provider Care Teams.  These Care Teams include your primary Cardiologist (physician) and Advanced Practice Providers (APPs -  Physician Assistants and Nurse Practitioners) who all work together to provide you with the care you need, when you need it.  We recommend signing up for the patient portal called "MyChart".  Sign up information is provided on this After Visit Summary.  MyChart is used to connect with patients for Virtual Visits (Telemedicine).  Patients are able to view lab/test results, encounter notes, upcoming appointments, etc.  Non-urgent messages can be sent to your provider as well.   To learn more about what you can do with MyChart, go to [email or web address].    Your next appointment:   1 year(s)  Provider:   Philip Nahser, MD      

## 2022-09-29 ENCOUNTER — Other Ambulatory Visit: Payer: Self-pay | Admitting: Cardiovascular Disease

## 2022-10-24 ENCOUNTER — Encounter: Payer: Self-pay | Admitting: Cardiovascular Disease

## 2022-10-25 ENCOUNTER — Other Ambulatory Visit (HOSPITAL_COMMUNITY): Payer: Self-pay

## 2022-10-25 ENCOUNTER — Telehealth: Payer: Self-pay | Admitting: Pharmacy Technician

## 2022-10-25 NOTE — Telephone Encounter (Signed)
Pharmacy Patient Advocate Encounter   Received notification from Patient Advice Request messages that prior authorization for repatha is required/requested.   Insurance verification completed.   The patient is insured through [employer] [facility] .   Per test claim: PA required; PA submitted to [facility] via CoverMyMeds Key/confirmation #/EOC [id number] Status is pending

## 2022-10-25 NOTE — Telephone Encounter (Signed)
Pharmacy Patient Advocate Encounter  Received notification from [facility] that Prior Authorization for REPATHA has been APPROVED from 10/25/22 to 01/01/23   PA #/Case ID/Reference #: [id number]

## 2022-10-26 IMAGING — DX DG CHEST 2V
2 series · 2 of 2 positions shown · non-contrast
Comparison: CT 08/05/2020

CLINICAL DATA: Left upper chest and shoulder pain

EXAM:
CHEST - 2 VIEW

[chest lat]
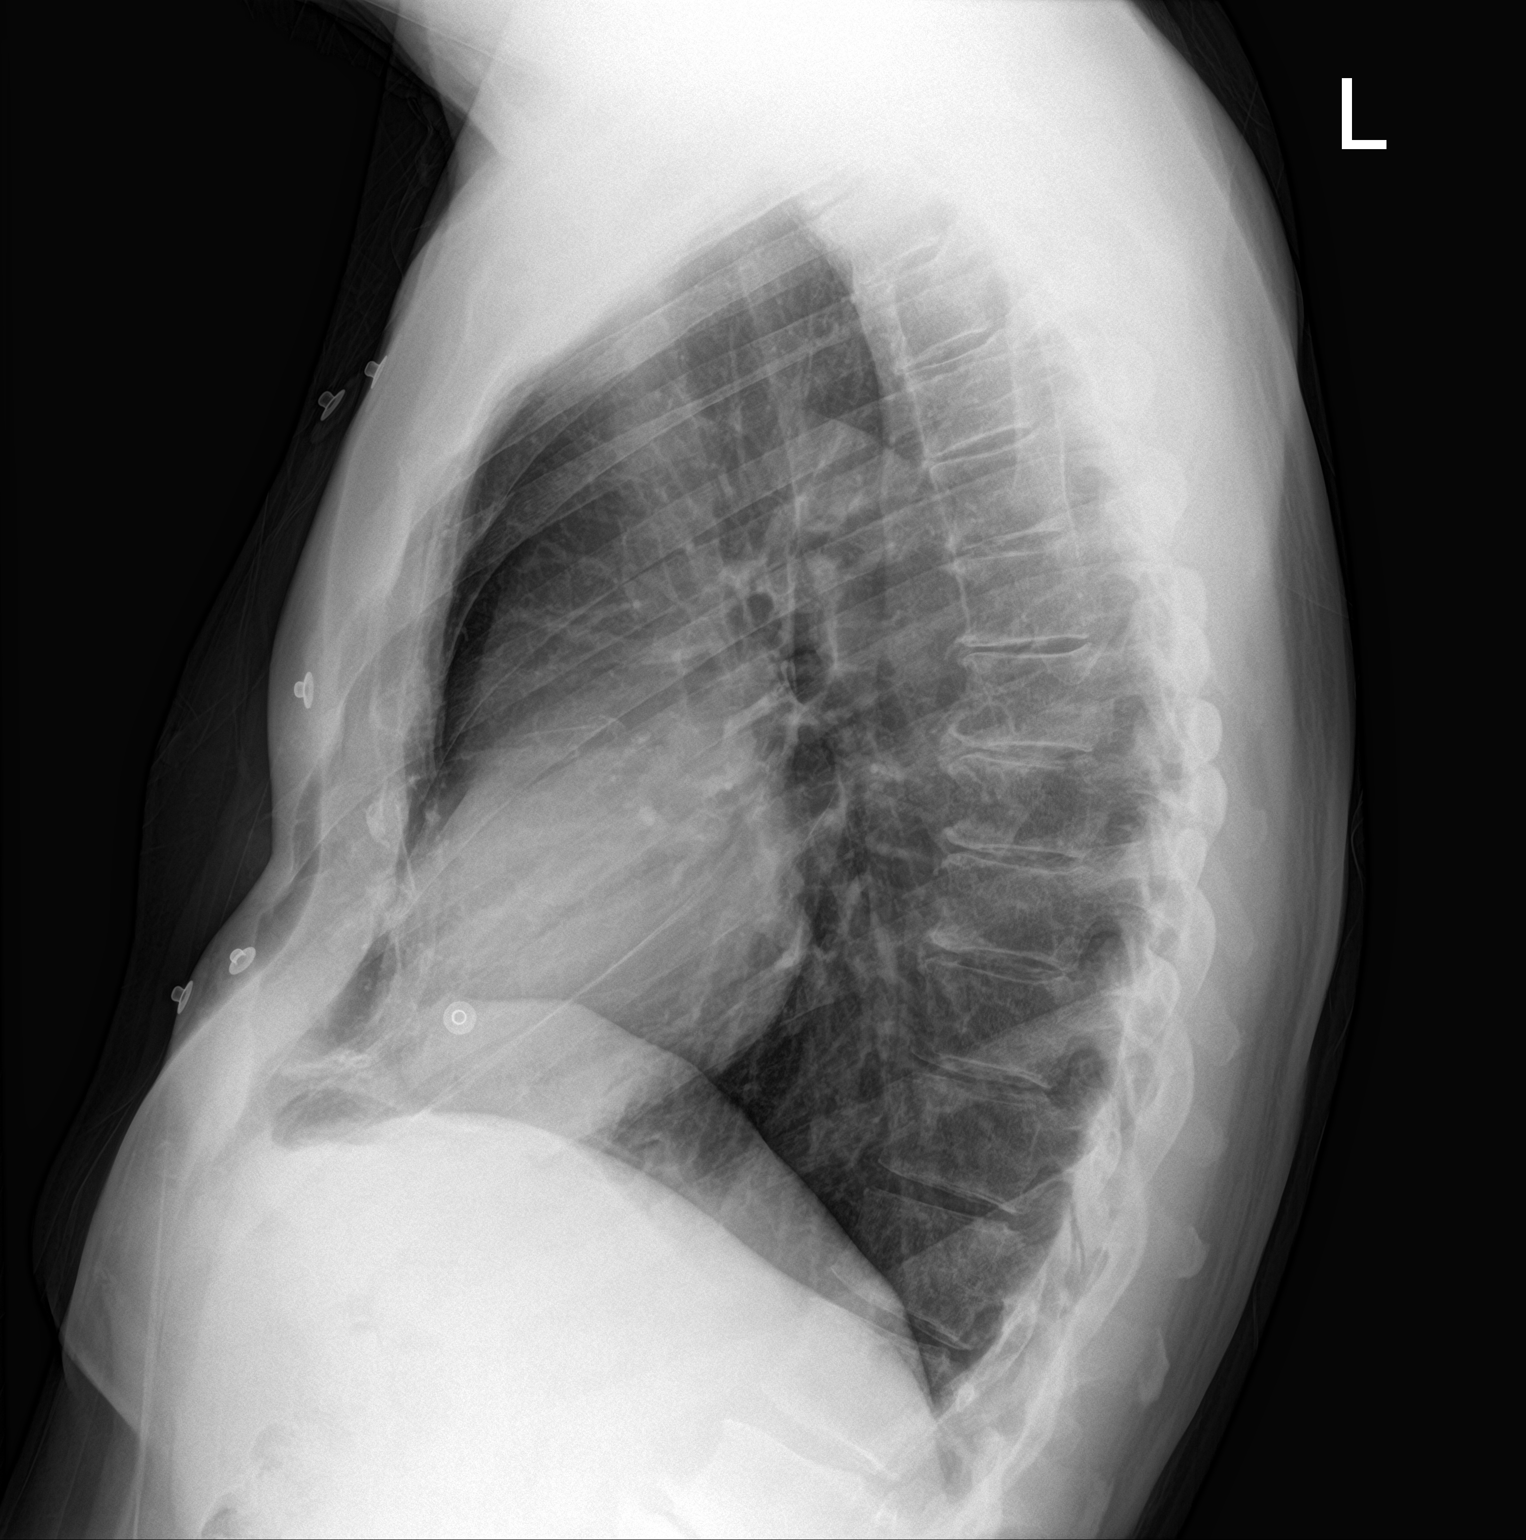

[chest pa]
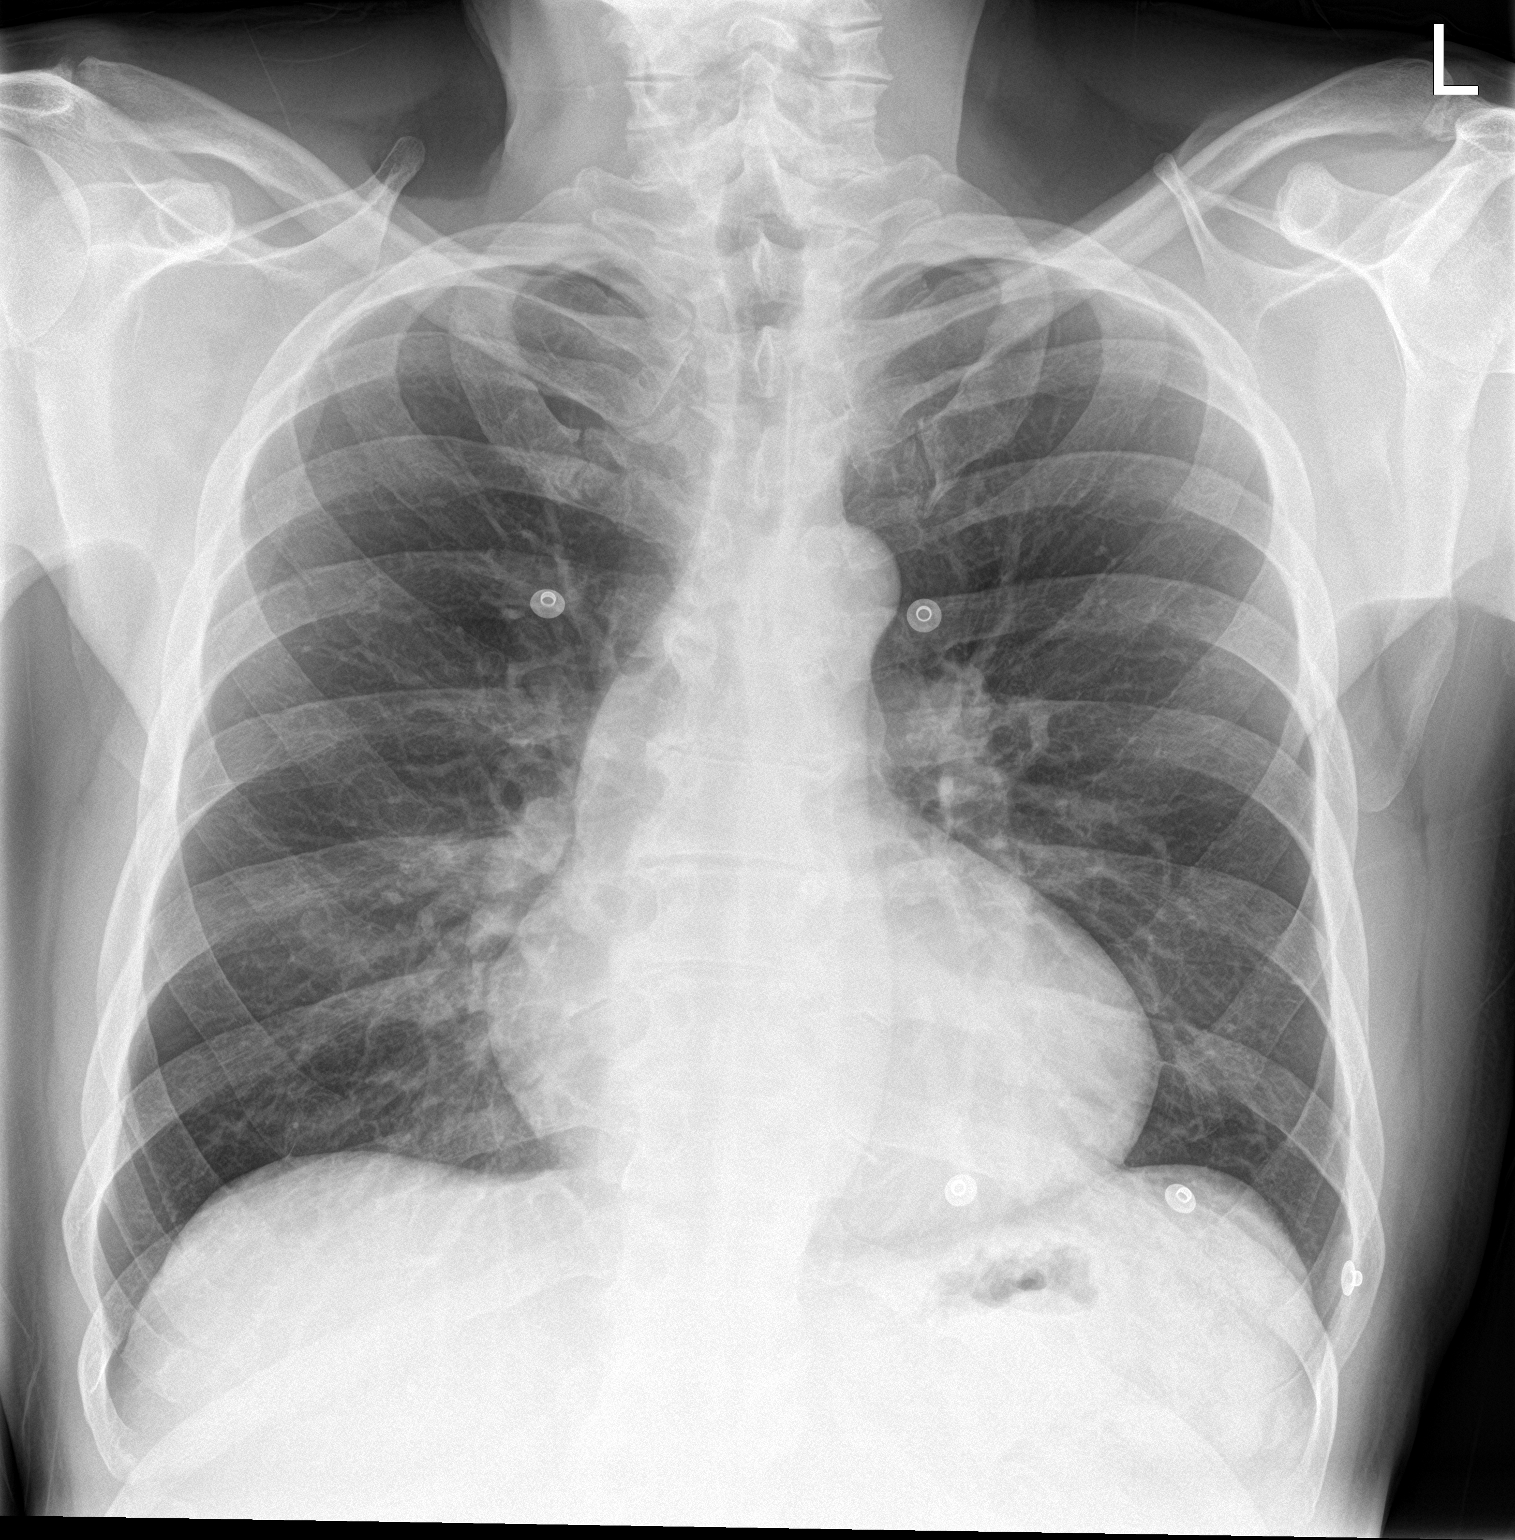

[2 of 2 positions shown; findings below may reference images not displayed]

FINDINGS: A pectus deformity of the chest is again noted, resulting in some
accentuation of the perihilar vasculature. No consolidation,
features of edema, pneumothorax, or effusion. The cardiomediastinal
contours are unremarkable. No acute osseous or soft tissue
abnormality.
IMPRESSION: No acute cardiopulmonary abnormality.

Pectus deformity of the chest.

## 2022-12-06 ENCOUNTER — Other Ambulatory Visit: Payer: Self-pay | Admitting: Cardiovascular Disease

## 2022-12-27 ENCOUNTER — Other Ambulatory Visit: Payer: Self-pay | Admitting: Cardiovascular Disease

## 2022-12-27 DIAGNOSIS — R079 Chest pain, unspecified: Secondary | ICD-10-CM

## 2022-12-27 DIAGNOSIS — E7800 Pure hypercholesterolemia, unspecified: Secondary | ICD-10-CM

## 2022-12-27 DIAGNOSIS — I25119 Atherosclerotic heart disease of native coronary artery with unspecified angina pectoris: Secondary | ICD-10-CM

## 2022-12-31 ENCOUNTER — Encounter: Payer: Self-pay | Admitting: Cardiovascular Disease

## 2023-01-12 ENCOUNTER — Encounter: Payer: Self-pay | Admitting: Cardiovascular Disease

## 2023-01-12 NOTE — Progress Notes (Signed)
 Cardiology Office Note:    Date:  01/14/2023   ID:  Bradley Dean, DOB [birth year], MRN [id number]  PCP:  Okey Carlin Redbird, MD   [facility] Providers Cardiologist:  Aleene Passe, MD     Referring MD: Okey Carlin Redbird, MD   Chief Complaint  Patient presents with   Hyperlipidemia   Coronary Artery Disease         May 11, 2020   AEDIN JEANSONNE is a 68 y.o. male with a hx of HTN, HLD I last saw [name] in 2012. He has been seeing [name], NP Family hx of CAD .  Had a remote cath in 2004  Works for [employer]. No CP or dyspnea  Does a fair amount of cycling without any difficulty  Has some orthostatic hypotension  Has had borderline hyperglycemia   BP has been well controlled.  . Has a + family hx of CAD Father died in his 89s of MI Brother died in his 5 s   Did not tolerate atorvastatin  ( muscle aches)  Helped with Co-Q 10  Tolerates a low dose  rosuvastatin     July 19, 2020:  [name] is seen today for follow up of his HLD and coronary artery calcification  Seen with wife, [name] .  Coronary calcium  score of 1646. This was 45 percentile for age-, race-, and sex-matched controls. We started ASA 81 mg a day  Lexiscan  myoview  showed no ischemia.  Normal LV function  He is here today to discuss   With exercise, ( biking) ,  gets PVCs when his HR is 130-140, will have PVCs  - several seconds of arrhythmia  Also when he is walking  Also has chest tightness , indigestion like sensation with exertion  No other GI issues   Sept. 6, 2022:  Seen with wife,  Boden Stucky is seen today for follow up visit for his CAD and a recent ER visit .  Cath on July 19, 2020 shows significant proximal -mid LAD calcification.   Has mid LAD of 65% , ostial diag is 75%. He presented to the ER on Aug. 11. Troponins were negative . CP was thought to be MSK pain  We added Imdur  30 mg a day .   Naprosyn was added.  Has not had any recurrent left arm pain   Headaches are better Has not had any specific CP  Has had some rib and back soreness Is having some PVC like symptoms  Rode his bike yesterday - felt well after he warmed up    Has a question about his bill from a recent cath   March 13, 2021  Freedom  is seen today for follow up of his CAD Had a cath in July , 2022   Has mid LAD stenosis of 65%, D1 75% Was treated medically with imdur    No recent episodes of CP  No recent ER visits  Glucose and HbA1C has been going up - ? 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   June 06, 2021: [name] is seen today for follow up visit  For his CAD, Mildly dilated ascending aorta. CTA shows asc. Aorta size of 4.0  cm. Has normal LV function.  1 week ago ( May 29)  , went to the beach. Family stress On memorial day , walked ~ 6 miles ( 20,000 steps)  Did not have any issues while exercising  Has noted some intrascapular pain with exercise  But also has noted some stiffness of his spine Ate some spicy food, woke up with abd discomfort, some chest pain , sweats .  Took a SL NTG  - did not help the discomfort  30 min later, the pain gradually resolved,   no residual pain   Will get a Troponin T today   Dec. 4, 2023 [name] is seen today for follow up of his CAD, Had CP worrisome fo UAP in June Troponin was normal  CTA of the aorta showed a stable 4.0 cm asc. Aorta  needs follow up CTA of aorta next year    Has had some palpitations - clinically sound like PVCs  He has known PVCs   - takes Inderal   Exercises 70 min per day .  Largely without pain  - sometimes has some L shoulder pain   Has some joint pain in toes and  knees.  Seeing a rheumatologist .  Still on Imdur  ,  doing well .   Lipids in  Oct. , 2023: Chol = 101 HDL = 52 LDL = 36 Trig = 54   March 23, 2022 [name] is seen for follow up of his CAD  Has been having some angina symptoms  ETT myoview  :  walked for 9 min, had some dyspnea No ischemia , mildly reduced LVEF of 47%  Echo shows low normal EF of 50-55%. 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  He has a history of coronary artery disease, mildly dilated ascending aorta.  Hyperlipidemia.  Has had several events associated with increased HR  Typicaly after he is out working in the sun, perhaps dehydrated  The HR increased seem to be immediate   He has had episodes of SVT on event monitor Has achieved Hr of 190  HR is slow at baseline.  I do not think he would tolerate scheduled beta blocker   I taught him Valsalva maneuver and cold water reflex which he may be able to use if he has sustained episodes of SVT.  Jan. 13, 2025 [name] is seen for follow up for his  moderate CAD, SVT, mildly dilated ascending aorta   Has been awakened in the middle of the night ,twice. With abdominal pain, he took a NTG - the pain resolved in about 15 minutes .    On several occasions, he has developed some chest pressure in the center of his chest .   Belches with relief of the pain   Rode indoors this past Saturday for 1 hour with no CP or any symptoms   He is on Imdur  45 mg a day   Has rare palpitations ( has SVT on an event monitor from 2023 )  He has propranolol  that he takes only on an as needed basis.  We have discussed putting him  on scheduled long acting beta-blockers but his baseline heart rate is 59.       Past  Medical History:  Diagnosis Date   Chest pain    negative stress echo in 2012   Hyperlipidemia    Hypertension     Past Surgical History:  Procedure Laterality Date   CARDIAC CATHETERIZATION     EF 65-&70% -- Normal left ventricular systolic function -- Smooth and normal coronary arteries. -- Aleene DOROTHA Passe, M.D.   LEFT HEART CATH AND CORONARY ANGIOGRAPHY N/A 07/22/2020   Procedure: LEFT HEART CATH AND CORONARY ANGIOGRAPHY;  Surgeon: Claudene Victory ORN, MD;  Location: [facility] INVASIVE CV LAB;  Service: Cardiovascular;  Laterality: N/A;    Current Medications: Current Meds  Medication Sig   allopurinol (ZYLOPRIM) 100 MG tablet Take 200 mg by mouth daily.   aspirin  EC 81 MG tablet Take 81 mg by mouth at bedtime. Swallow whole.   Cholecalciferol (VITAMIN D3) 125 MCG (5000 UT) CAPS Per patient taking 2,000 units by mouth every morning   Coenzyme Q10 (COQ10) 200 MG CAPS Take 200 mg by mouth at bedtime.   colchicine 0.6 MG tablet Take 0.6 mg by mouth as needed (for gout flare up).   Evolocumab  (REPATHA  SURECLICK) 140 MG/ML SOAJ INJECT 1 PEN UNDER THE SKIN EVERY 14 DAYS   icosapent  Ethyl (VASCEPA ) 1 g capsule TAKE 2 CAPSULES BY MOUTH 2 TIMES DAILY. NOT IN NETWORK   isosorbide  mononitrate (IMDUR ) 30 MG 24 hr tablet Take 1.5 tablets (45 mg total) by mouth daily.   loratadine (CLARITIN) 10 MG tablet Take 10 mg by mouth daily as needed for allergies.   losartan  (COZAAR ) 50 MG tablet TAKE 1 TABLET BY MOUTH DAILY   Menaquinone-7 (VITAMIN K2) 100 MCG CAPS Take 200 mcg by mouth in the morning.   metFORMIN (GLUCOPHAGE-XR) 500 MG 24 hr tablet Take 1,000 mg by mouth daily.   nitroGLYCERIN  (NITROSTAT ) 0.4 MG SL tablet DISSOLVE 1 TABLET UNDER THE TONGUE EVERY 5 MINUTES AS  NEEDED FOR CHEST PAIN. MAX  OF 3 TABLETS IN 15 MINUTES. CALL 911 IF PAIN PERSISTS.   propranolol  (INDERAL ) 10 MG tablet TAKE 1 TABLET BY MOUTH 4 TIMES  DAILY AS NEEDED FOR PREMATURE  VENTRICULAR CONTRACTIONS   rosuvastatin  (CRESTOR ) 10 MG  tablet TAKE 1 TABLET BY MOUTH DAILY     Allergies:   Fluticasone, Sulfa antibiotics, Sulfa drugs cross reactors, and Lipitor [atorvastatin ]   Social History   Socioeconomic History   Marital status: Married    Spouse name: Not on file   Number of children: Not on file   Years of education: Not on file   Highest education level: Not on file  Occupational History   Not on file  Tobacco Use   Smoking status: Never   Smokeless tobacco: Never  Vaping Use   Vaping status: Never Used  Substance and Sexual Activity   Alcohol use: No   Drug use: No   Sexual activity: Not on file  Other Topics Concern   Not on file  Social History Narrative   Not on file   Social Drivers of Health   Financial Resource Strain: Not on file  Food Insecurity: Not on file  Transportation Needs: Not on file  Physical Activity: Not on file  Stress: Not on file  Social Connections: Not on file     Family History: The patient's family history includes Heart disease in his father.  ROS:   Please see the history of present illness.  All other systems reviewed and are negative.  EKGs/Labs/Other Studies Reviewed:    The following studies were reviewed today:   Recent Labs: No results found for requested labs within last 365 days.  Recent Lipid Panel    Component Value Date/Time   CHOL 97 (L) 03/13/2021 1014   TRIG 65 03/13/2021 1014   HDL 50 03/13/2021 1014   CHOLHDL 1.9 03/13/2021 1014   CHOLHDL 3.7 06/28/2015 0857   VLDL 28 06/28/2015 0857   LDLCALC 33 03/13/2021 1014     Risk Assessment/Calculations:       Physical Exam:     Physical Exam: Blood pressure 122/78, pulse (!) 59, height 6' 1 (1.854 m), weight 194 lb 6.4 oz (88.2 kg), SpO2 96%.       GEN:  Well nourished, well developed in no acute distress HEENT: Normal NECK: No JVD; No carotid bruits LYMPHATICS: No lymphadenopathy CARDIAC: RRR , no murmurs, rubs, gallops RESPIRATORY:  Clear to auscultation without  rales, wheezing or rhonchi  ABDOMEN: Soft, non-tender, non-distended MUSCULOSKELETAL:  No edema; No deformity  SKIN: Warm and dry NEUROLOGIC:  Alert and oriented x 3    EKG:      EKG Interpretation Date/Time:  Monday January 14 2023 15:40:11 EST Ventricular Rate:  67 PR Interval:  136 QRS Duration:  94 QT Interval:  434 QTC Calculation: 458 R Axis:   5  Text Interpretation: Normal sinus rhythm Incomplete right bundle branch block When compared with ECG of 11-Aug-2020 02:42, QRS axis Shifted right Confirmed by Alveta Mungo ([id number]) on 01/14/2023 4:32:26 PM    ASSESSMENT:    1. Coronary artery disease involving native coronary artery of native heart without angina pectoris          PLAN:     Unstable angina:    He has been having some unusual episodes of chest discomfort.  They are not always associated with exertion.  And not all exertional workouts result in any chest discomfort.  He will continue to keep an eye on this.  If he starts to develop a pattern of chest tightness and pressure with exertion then he will likely need a repeat heart catheterization.  He is on isosorbide  45mg  a day.   2.  Hyperlipidemia:   Lipids look good.   3.   Mild aortic dilatation: He has mild dilatation of his ascending aorta.  Will get an MR angiogram to follow-up with his 4.0 cm thoracic aortic aneurysm.    4.  Supraventricular tachycardia:            Medication Adjustments/Labs and Tests Ordered: Current medicines are reviewed at length with the patient today.  Concerns regarding medicines are outlined above.  Orders Placed This Encounter  Procedures   EKG 12-Lead     No orders of the defined types were placed in this encounter.     Patient Instructions  Medication Instructions:  Your physician recommends that you continue on your current medications as directed. Please refer to the Current Medication list given to you today.  *If you need a refill on your cardiac  medications before your next appointment, please call your pharmacy*  Lab Work: None ordered today.  Testing/Procedures: Your physician has requested that you have a MR angio chest performed.  Follow-Up: At [facility], you and your health needs are our priority.  As part of our continuing mission to provide you with exceptional heart care, we have created designated Provider Care Teams.  These Care Teams include your primary  Cardiologist (physician) and Advanced Practice Providers (APPs -  Physician Assistants and Nurse Practitioners) who all work together to provide you with the care you need, when you need it.  Your next appointment:   6 month(s)  The format for your next appointment:   In Person  Provider:   Glendia Ferrier, PA-C     Then, Lonni Cash, MD will plan to see you again in 1 year(s).{   Signed, Aleene Passe, MD  01/14/2023 4:32 PM    [facility]

## 2023-01-14 ENCOUNTER — Ambulatory Visit: Payer: Medicare Other | Attending: Cardiovascular Disease | Admitting: Cardiovascular Disease

## 2023-01-14 VITALS — BP 122/78 | HR 59 | Ht 73.0 in | Wt 194.4 lb

## 2023-01-14 DIAGNOSIS — I2510 Atherosclerotic heart disease of native coronary artery without angina pectoris: Secondary | ICD-10-CM | POA: Diagnosis not present

## 2023-01-14 NOTE — Patient Instructions (Addendum)
 Medication Instructions:  Your physician recommends that you continue on your current medications as directed. Please refer to the Current Medication list given to you today.  *If you need a refill on your cardiac medications before your next appointment, please call your pharmacy*  Lab Work: None ordered today.  Testing/Procedures: Your physician has requested that you have a MR angio chest performed.  Follow-Up: At [facility], you and your health needs are our priority.  As part of our continuing mission to provide you with exceptional heart care, we have created designated Provider Care Teams.  These Care Teams include your primary Cardiologist (physician) and Advanced Practice Providers (APPs -  Physician Assistants and Nurse Practitioners) who all work together to provide you with the care you need, when you need it.  Your next appointment:   6 month(s)  The format for your next appointment:   In Person  Provider:   Glendia Ferrier, PA-C     Then, Lonni Cash, MD will plan to see you again in 1 year(s).{

## 2023-01-23 ENCOUNTER — Ambulatory Visit: Payer: Medicare Other | Admitting: Cardiovascular Disease

## 2023-01-23 ENCOUNTER — Ambulatory Visit (HOSPITAL_COMMUNITY)
Admission: RE | Admit: 2023-01-23 | Discharge: 2023-01-23 | Disposition: A | Discharge status: Home / Self Care | Payer: Medicare Other | Source: Ambulatory Visit | Attending: Cardiovascular Disease | Admitting: Cardiovascular Disease

## 2023-01-23 DIAGNOSIS — I7121 Aneurysm of the ascending aorta, without rupture: Secondary | ICD-10-CM | POA: Insufficient documentation

## 2023-01-23 DIAGNOSIS — I77810 Thoracic aortic ectasia: Secondary | ICD-10-CM | POA: Diagnosis present

## 2023-01-23 DIAGNOSIS — I25119 Atherosclerotic heart disease of native coronary artery with unspecified angina pectoris: Secondary | ICD-10-CM | POA: Insufficient documentation

## 2023-01-23 MED ORDER — GADOBUTROL 1 MMOL/ML IV SOLN
9.0000 mL | Freq: Once | INTRAVENOUS | Status: AC | PRN
  Administered 2023-01-23: 9 mL via INTRAVENOUS

## 2023-01-24 ENCOUNTER — Encounter: Payer: Self-pay | Admitting: Cardiovascular Disease

## 2023-01-28 ENCOUNTER — Telehealth: Payer: Self-pay

## 2023-01-28 DIAGNOSIS — I7121 Aneurysm of the ascending aorta, without rupture: Secondary | ICD-10-CM

## 2023-01-28 DIAGNOSIS — I77819 Aortic ectasia, unspecified site: Secondary | ICD-10-CM

## 2023-01-28 NOTE — Telephone Encounter (Signed)
-----   Message from [name] sent at 01/24/2023  3:48 PM EST ----- Difficult to measure the aortic size due to motion artifact Measures 3.9 cm on this study  This is essentially unchanged from last year when the asc. Aorta measured 4.0 cm  Repeat CTA of aorta in 1 year

## 2023-01-28 NOTE — Telephone Encounter (Signed)
Patient has viewed MD's comments and recommendations via MyChart. Repeat CTA of Aorta placed at this time to be done next year.

## 2023-02-06 ENCOUNTER — Encounter: Payer: Self-pay | Admitting: Cardiovascular Disease

## 2023-02-07 ENCOUNTER — Other Ambulatory Visit (HOSPITAL_COMMUNITY): Payer: Self-pay

## 2023-02-07 ENCOUNTER — Telehealth: Payer: Self-pay

## 2023-02-07 NOTE — Telephone Encounter (Signed)
 Pharmacy Patient Advocate Encounter  Received notification from [facility] MEDICARE that Prior Authorization for REPATHA  has been APPROVED from 02/07/23 to 02/07/24

## 2023-02-07 NOTE — Telephone Encounter (Signed)
 Pharmacy Patient Advocate Encounter   Received notification from Pt Calls Messages that prior authorization for REPATHA  is required/requested.   Insurance verification completed.   The patient is insured through [facility] MEDICARE .   Per test claim: PA required; PA submitted to above mentioned insurance via CoverMyMeds Key/confirmation #/EOC [id number] Status is pending

## 2023-02-07 NOTE — Telephone Encounter (Signed)
 PA request has been Submitted. New Encounter created for follow up. For additional info see Pharmacy Prior Auth telephone encounter from 02/07/23.

## 2023-02-08 ENCOUNTER — Ambulatory Visit: Payer: Medicare Other | Admitting: Cardiovascular Disease

## 2023-02-22 ENCOUNTER — Encounter: Payer: Self-pay | Admitting: Nurse Practitioner

## 2023-02-22 ENCOUNTER — Ambulatory Visit: Payer: Medicare Other | Attending: Nurse Practitioner | Admitting: Nurse Practitioner

## 2023-02-22 ENCOUNTER — Encounter: Payer: Self-pay | Admitting: Cardiovascular Disease

## 2023-02-22 VITALS — BP 108/68 | HR 56 | Ht 73.0 in | Wt 201.8 lb

## 2023-02-22 DIAGNOSIS — I10 Essential (primary) hypertension: Secondary | ICD-10-CM

## 2023-02-22 DIAGNOSIS — E7800 Pure hypercholesterolemia, unspecified: Secondary | ICD-10-CM

## 2023-02-22 DIAGNOSIS — I7121 Aneurysm of the ascending aorta, without rupture: Secondary | ICD-10-CM | POA: Diagnosis present

## 2023-02-22 DIAGNOSIS — I2510 Atherosclerotic heart disease of native coronary artery without angina pectoris: Secondary | ICD-10-CM

## 2023-02-22 DIAGNOSIS — R079 Chest pain, unspecified: Secondary | ICD-10-CM

## 2023-02-22 NOTE — Telephone Encounter (Signed)
If he is still experiencing symptoms, he should go to the ED.  I would not recommend getting a Troponin without evaluating him. If he is not having continued symptoms, please arrange appt ASAP for evaluation. He can see Dr. Elease Hashimoto, me or any other APP. [name], PA-C    02/22/2023 9:48 AM

## 2023-02-22 NOTE — Progress Notes (Signed)
Cardiology Office Note    Patient Name: Bradley Dean Date of Encounter: 02/22/2023  Primary Care Provider:  Daisy Floro, MD Primary Cardiologist:  [name], MD Primary Electrophysiologist: None   Past Medical History    Past Medical History:  Diagnosis Date   Chest pain    negative stress echo in 2012   Hyperlipidemia    Hypertension     History of Present Illness  Bradley Dean is a 68 y.o. male with a PMH of CAD s/p LHC in 2004 with normal coronaries, repeat LHC 07/2020 with significant proximal mid LAD calcification of 65%, 75% ostial diagonal, SVT, PVCs, HTN, HLD, intolerance to Lipitor but tolerant to Crestor, mild aortic dilation, low-dose, who presents today for complaint of diaphoresis and abdominal and chest discomfort.  Bradley Dean was seen initially in 2012 by Dr. Elease Hashimoto for complaint of chest pain.  He endorsed discomfort after using the elliptical reported relief after belching and using Mylanta.  He completed a remote catheterization in 2004 that showed no evidence of CAD.  He underwent a stress echo in 2012 that was negative.  He underwent a Myoview in 2020 due to increased risk factors that was low risk and normal.  He was seen by Dr. Elease Hashimoto in 05/2020 and underwent a coronary calcium score for further evaluation that showed calcium score of 1646.  He was referred for left heart cath due to elevated calcium score that was completed on 07/2020 that showed 65% mid LAD stenosis, 75% ostial to first diagonal and chest pain was thought to be musculoskeletal in nature.  He was seen in follow-up on 06/2021 and noted following a 6 mile walk with resolution of discomfort with sublingual nitroglycerin.  He completed a exercise Myoview on 12/2021 was low risk and normal and showed no evidence of ischemia.  Was completed showing EF of 50-55% with normal diastolic function and mild aortic dilation of 40 mm.  He was last seen by Dr. Elease Hashimoto on 01/14/2023 for follow-up.  Twice in the middle  of the night with abdominal pain minutes with nitroglycerin.  He also endorsed episodes of central chest pressure that resolved with belching.  He is currently on Imdur 40 mg a day and also on as needed propranolol for SVT.  He contacted our office on 02/22/2023 by MyChart message with report of and diaphoresis with mild abdominal and chest "something" but not really pain or gripping pressure.  He reports taking nitroglycerin and falling back asleep and also endorsed some gastric discomfort last night.  He was requesting troponin being tested to rule out cardiac damage.  Bradley Dean presents today for complaint of of sweating, mild abdominal discomfort, and chest discomfort. The episode occurred around 3:30 AM after waking up to urinate. The patient described feeling a bit clammy and hot, which was unusual. The patient also reported a feeling of fullness and a bit of gas in the abdomen. The patient took nitroglycerin as a precaution, which resulted in a feeling of numbness all over. The symptoms resolved after about 10-15 minutes. The patient reported no residual symptoms the following morning, but did mention a weak stomach and a couple of trips to the restroom. The patient also reported a recent upper respiratory infection with lingering cough and wheezing.  He reports the previous day exercising for approximately 1 hour on his stationary bike with no complaints of chest pain or shortness of breath.  He also denies any current episodes of palpitations. Patient denies chest pain,  palpitations, dyspnea, PND, orthopnea, nausea, vomiting, dizziness, syncope, edema, weight gain, or early satiety.  Review of Systems  Please see the history of present illness.    All other systems reviewed and are otherwise negative except as noted above.  Physical Exam    Wt Readings from Last 3 Encounters:  01/14/23 194 lb 6.4 oz (88.2 kg)  08/06/22 195 lb 9.6 oz (88.7 kg)  05/15/22 174 lb 12.8 oz (79.3 kg)   ZO:[id number] were  no vitals filed for this visit.,There is no height or weight on file to calculate BMI. GEN: Well nourished, well developed in no acute distress Neck: No JVD; No carotid bruits Pulmonary: Clear to auscultation without rales, wheezing or rhonchi  Cardiovascular: Normal rate. Regular rhythm. Normal S1. Normal S2.   Murmurs: There is no murmur.  ABDOMEN: Soft, non-tender, non-distended some mild GI upset EXTREMITIES:  No edema; No deformity   EKG/LABS/ Recent Cardiac Studies   ECG personally reviewed by me today -none completed today  Risk Assessment/Calculations:          Lab Results  Component Value Date   WBC 7.9 08/11/2020   HGB 14.8 08/11/2020   HCT 44.1 08/11/2020   MCV 95.0 08/11/2020   PLT 153 08/11/2020   Lab Results  Component Value Date   CREATININE 0.86 08/02/2021   BUN 23 08/02/2021   NA 139 08/02/2021   K 4.4 08/02/2021   CL 99 08/02/2021   CO2 25 08/02/2021   Lab Results  Component Value Date   CHOL 97 (L) 03/13/2021   HDL 50 03/13/2021   LDLCALC 33 03/13/2021   TRIG 65 03/13/2021   CHOLHDL 1.9 03/13/2021    Lab Results  Component Value Date   HGBA1C 5.9 (H) 05/11/2020   Assessment & Plan    1.  Coronary artery disease: -s/p LHC in 2022 with LAD (heavily calcified) proximal 60, mid 60, D2 75; RCA proximal 40, distal 50> med Rx and Myoview completed 12/20/21 with no ischemia and infarction -Presents today with episode of abdominal and chest pressure with diaphoresis -Continue current GDMT with ASA 81 mg, Vascepa 2 g twice daily Imdur 45 mg daily, Repatha 140 mg q. 14 days, Crestor 10 mg daily -Continue as needed Nitrostat for discomfort that is not relieved with rest valuation of chest pain changes in intensity and duration.  2.  Chest pressure/nocturnal symptoms: -Reported nocturnal symptoms of mild sweating, abdominal discomfort, and chest discomfort. Symptoms resolved within 10-15 minutes. No reproducibility of symptoms. No severe pressure, jaw pain,  or back pain. No residual symptoms upon waking. -Continue monitoring symptoms. -Consider over-the-counter Prilosec for potential GI-related symptoms. -Keep a food diary to identify potential triggers.  3.  Essential hypertension: -Patient's blood pressure today was controlled at 108/68 -Continue losartan 50 mg daily, propranolol as needed 10 mg  4.  Hyperlipidemia: -Patient's last LDL cholesterol was 64 at goal -Continue Repatha 140 mg q. 14 days and Vascepa 2 g twice daily, Crestor 10 mg daily  5.  Ascending aortic dilation: -Most recent 2D echo completed on 03/02/2022 dilation of the ascending aorta which was unchanged from previous study. -Continue yearly surveillance with echo/CT  6. Upper Respiratory Symptoms Reported recent upper respiratory symptoms with residual cough and wheezing noted on examination. -Monitor symptoms and seek follow-up with primary care provider if cough increases in frequency.  Disposition: Follow-up with [name], MD or [name], PA in 4 months   Signed, Napoleon Form, Leodis Rains, NP 02/22/2023, 10:32 AM [facility]

## 2023-02-22 NOTE — Patient Instructions (Signed)
Medication Instructions:  You can take over the counter prilosec as discussed. *If you need a refill on your cardiac medications before your next appointment, please call your pharmacy*  Lab Work: None ordered If you have labs (blood work) drawn today and your tests are completely normal, you will receive your results only by: MyChart Message (if you have MyChart) OR A paper copy in the mail If you have any lab test that is abnormal or we need to change your treatment, we will call you to review the results.  Follow-Up: At [facility], you and your health needs are our priority.  As part of our continuing mission to provide you with exceptional heart care, we have created designated Provider Care Teams.  These Care Teams include your primary Cardiologist (physician) and Advanced Practice Providers (APPs -  Physician Assistants and Nurse Practitioners) who all work together to provide you with the care you need, when you need it.  Your next appointment:   4 month(s)  Provider:   [name], PA-C

## 2023-03-04 ENCOUNTER — Ambulatory Visit: Payer: Medicare Other | Attending: Cardiovascular Disease

## 2023-03-04 DIAGNOSIS — I7121 Aneurysm of the ascending aorta, without rupture: Secondary | ICD-10-CM

## 2023-03-04 DIAGNOSIS — I77810 Thoracic aortic ectasia: Secondary | ICD-10-CM

## 2023-03-04 DIAGNOSIS — I25119 Atherosclerotic heart disease of native coronary artery with unspecified angina pectoris: Secondary | ICD-10-CM

## 2023-03-14 ENCOUNTER — Encounter: Payer: Self-pay | Admitting: Cardiovascular Disease

## 2023-03-14 LAB — LIPID PANEL
Chol/HDL Ratio: 2.5 ratio (ref 0.0–5.0)
Cholesterol, Total: 96 mg/dL — ABNORMAL LOW (ref 100–199)
HDL: 38 mg/dL — ABNORMAL LOW (ref >39)
LDL Chol Calc (NIH): 41 mg/dL (ref 0–99)
Triglycerides: 85 mg/dL (ref 0–149)
VLDL Cholesterol Cal: 17 mg/dL (ref 5–40)

## 2023-03-14 LAB — ALT: ALT: 15 IU/L (ref 0–44)

## 2023-03-14 LAB — BASIC METABOLIC PANEL
BUN/Creatinine Ratio: 22 (ref 10–24)
BUN: 21 mg/dL (ref 8–27)
CO2: 24 mmol/L (ref 20–29)
Calcium: 9.4 mg/dL (ref 8.6–10.2)
Chloride: 100 mmol/L (ref 96–106)
Creatinine, Ser: 0.97 mg/dL (ref 0.76–1.27)
Glucose: 104 mg/dL — ABNORMAL HIGH (ref 70–99)
Potassium: 4 mmol/L (ref 3.5–5.2)
Sodium: 138 mmol/L (ref 134–144)
eGFR: 86 mL/min/{1.73_m2} (ref >59)

## 2023-04-12 IMAGING — CR DG KNEE 3 VIEWS*R*
3 series · 3 of 3 positions shown · non-contrast
Comparison: None.

CLINICAL DATA: Right knee pain.

EXAM:
RIGHT KNEE - 3 VIEW

[w knee ap right]
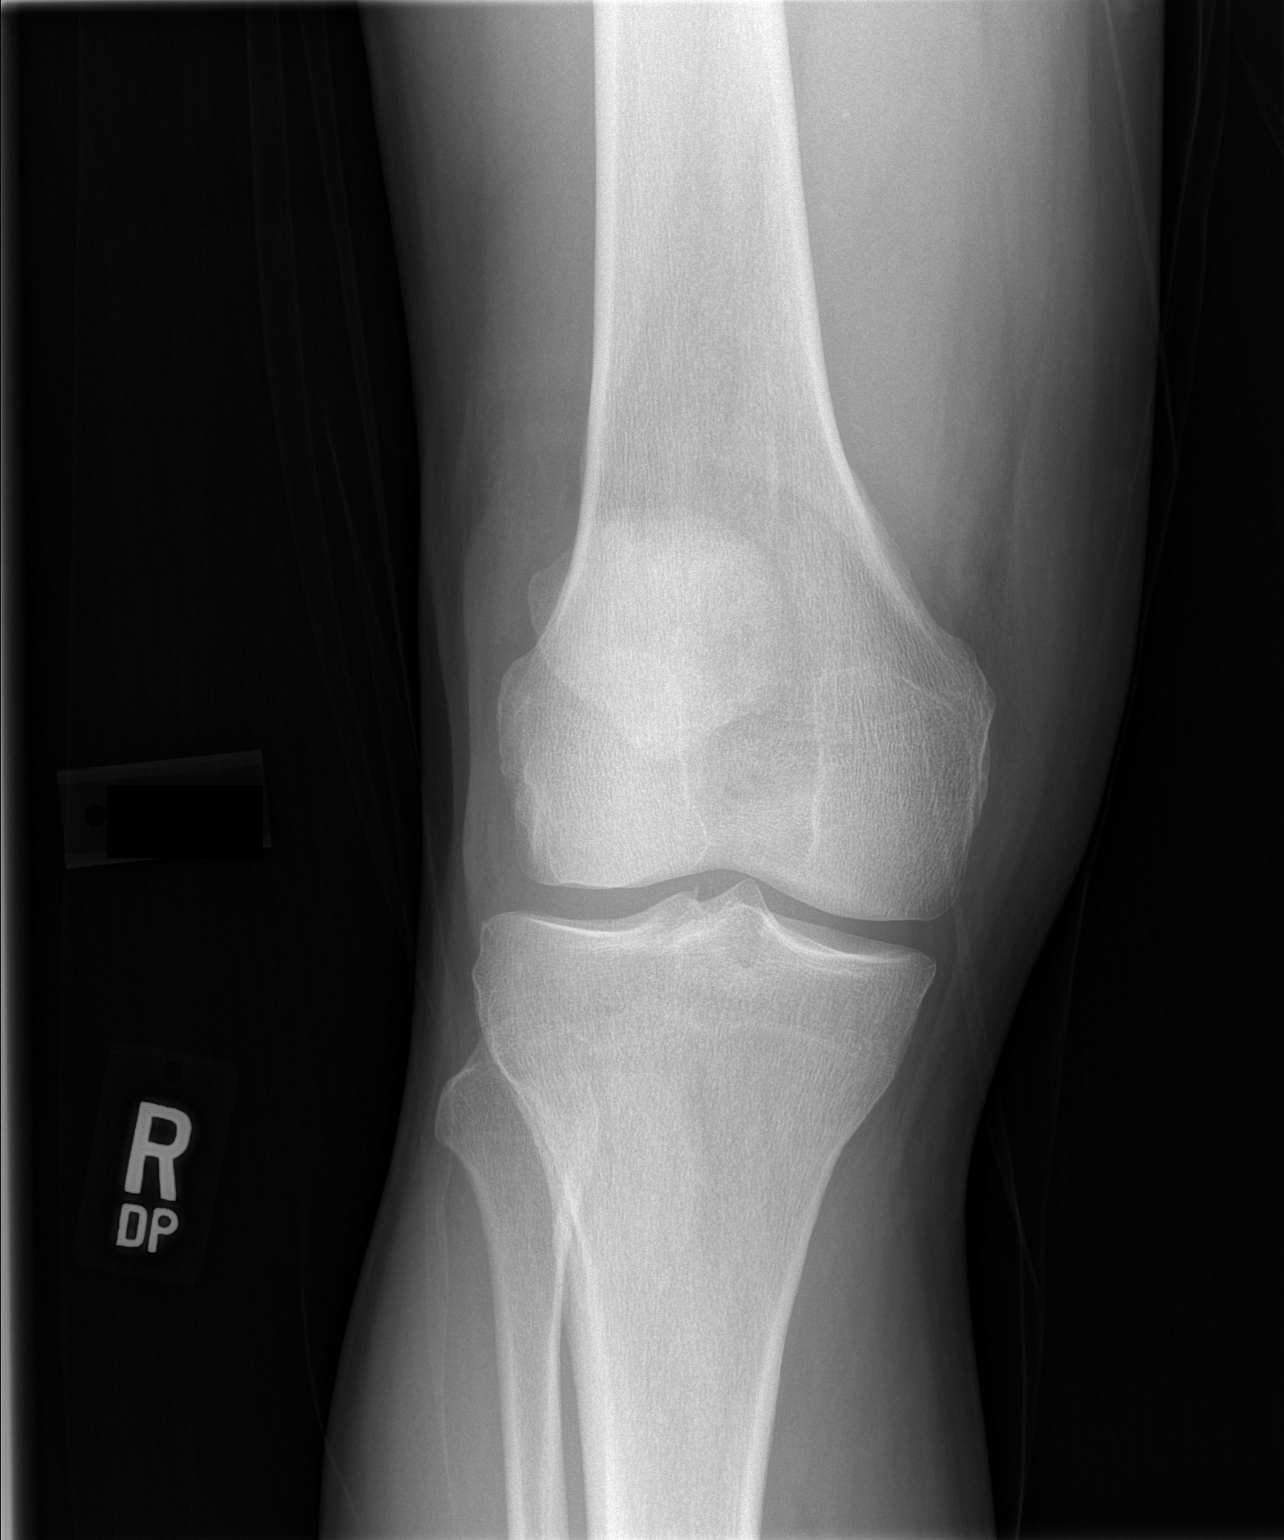

[w knee lat. right]
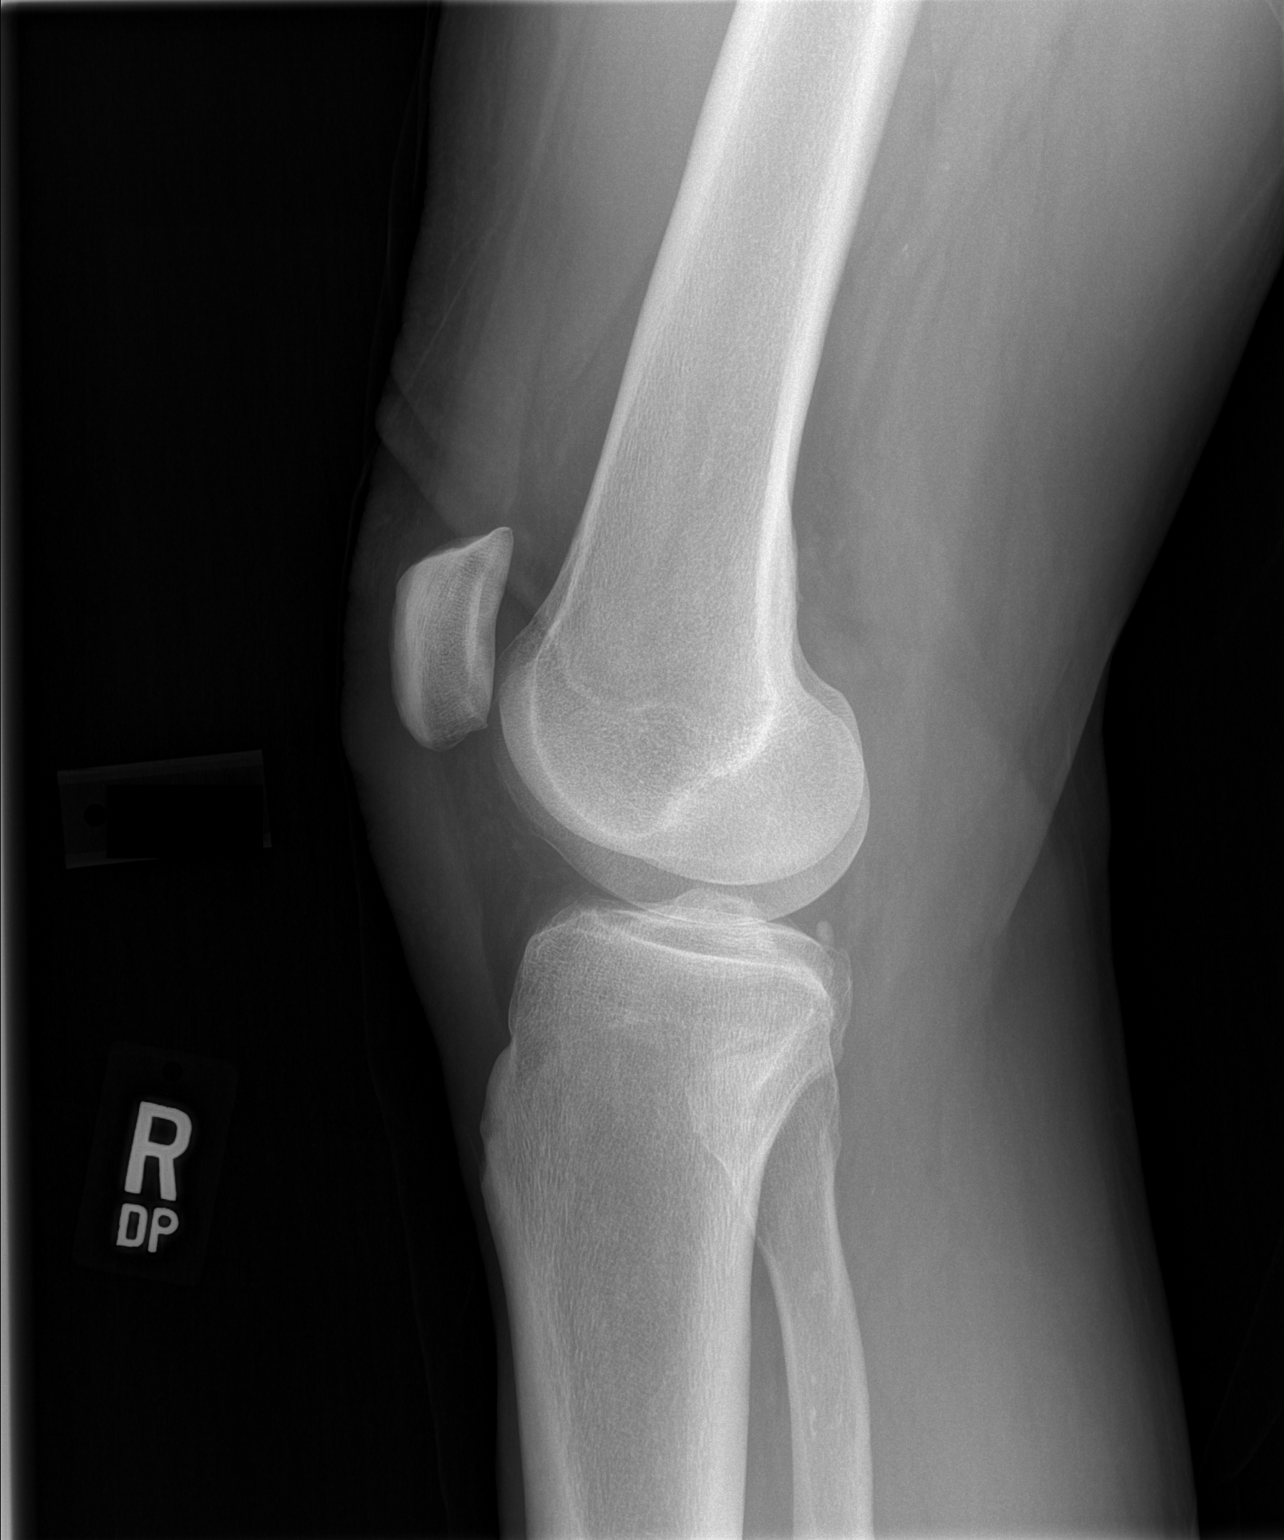

[t patella  right]
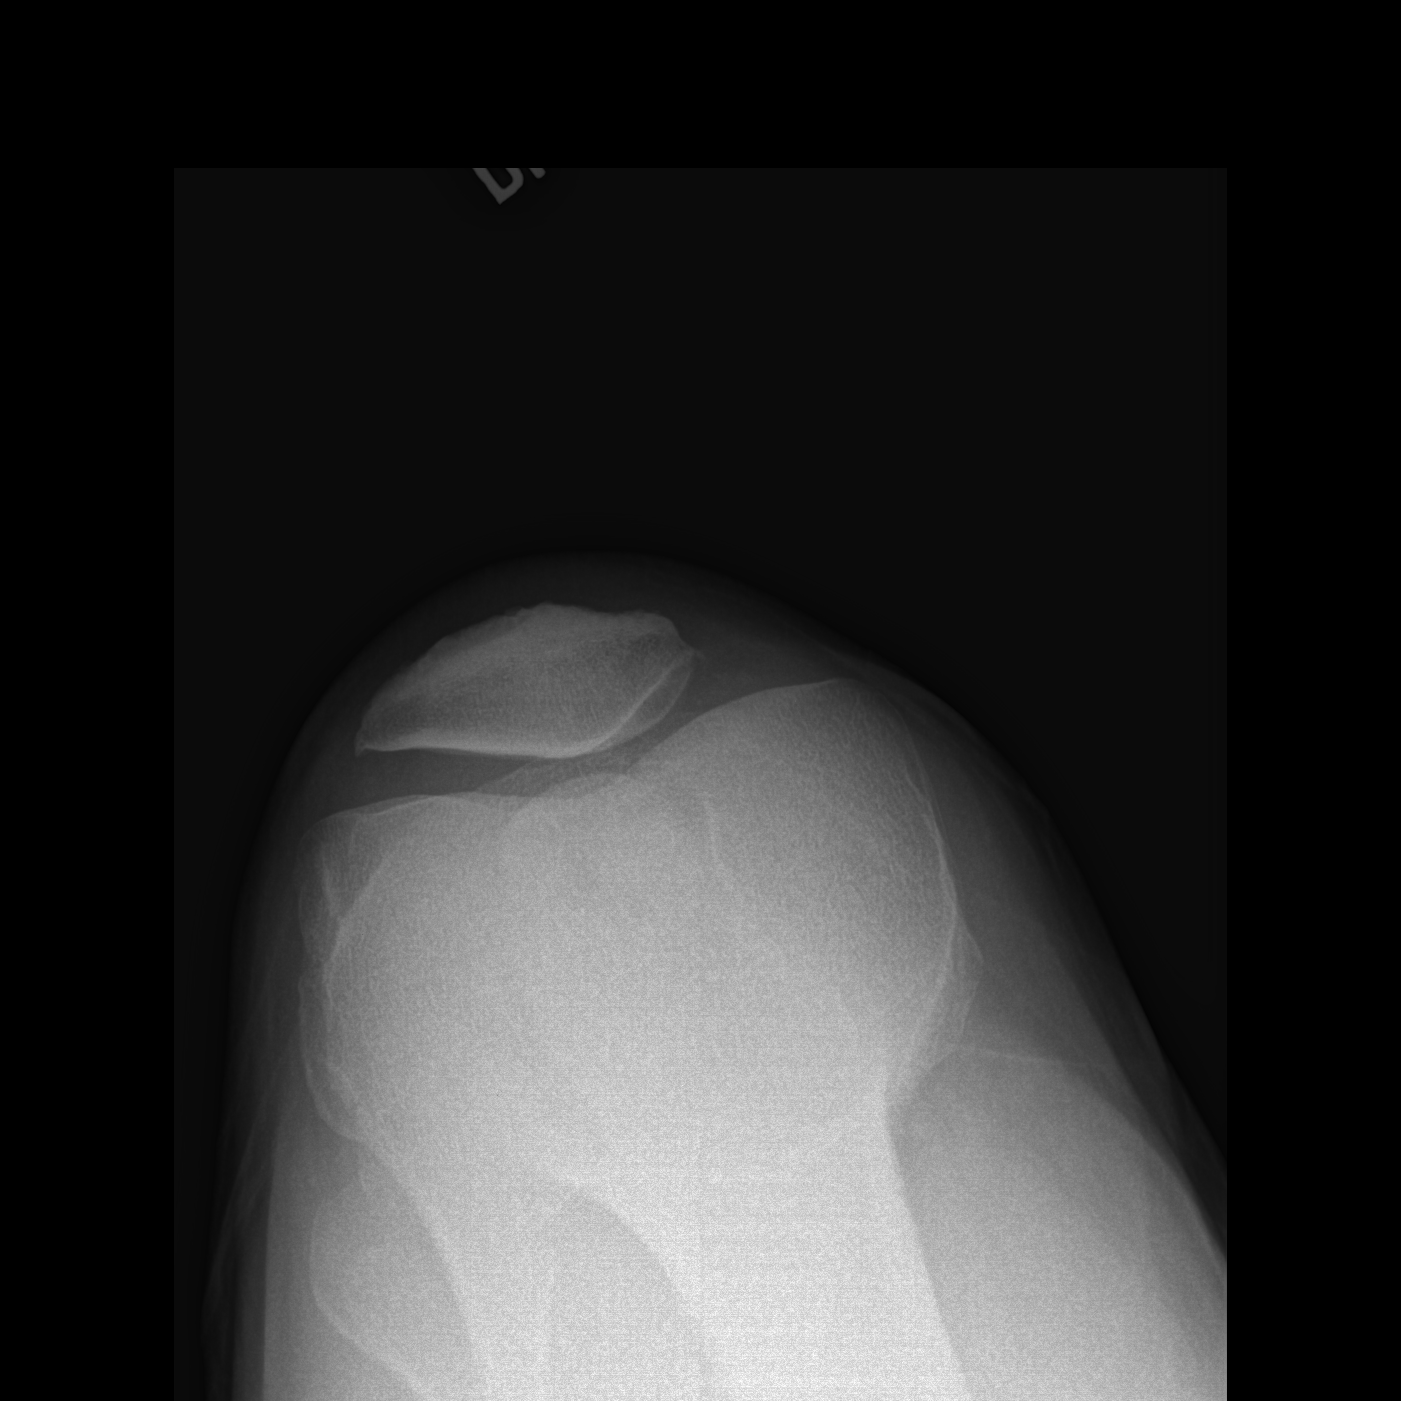

[3 of 3 positions shown; findings below may reference images not displayed]

FINDINGS: No evidence of fracture, dislocation, or joint effusion. No evidence
of arthropathy or other focal bone abnormality. Soft tissues are
unremarkable.
IMPRESSION: Negative.

## 2023-05-08 ENCOUNTER — Other Ambulatory Visit: Payer: Self-pay | Admitting: Physician Assistant

## 2023-05-28 ENCOUNTER — Other Ambulatory Visit: Payer: Self-pay | Admitting: Cardiovascular Disease

## 2023-06-04 ENCOUNTER — Encounter: Payer: Self-pay | Admitting: Physician Assistant

## 2023-06-04 ENCOUNTER — Encounter: Payer: Self-pay | Admitting: *Deleted

## 2023-06-04 ENCOUNTER — Ambulatory Visit: Payer: Medicare Other | Attending: Physician Assistant | Admitting: Physician Assistant

## 2023-06-04 VITALS — BP 112/54 | HR 62 | Ht 72.0 in | Wt 198.4 lb

## 2023-06-04 DIAGNOSIS — I25119 Atherosclerotic heart disease of native coronary artery with unspecified angina pectoris: Secondary | ICD-10-CM | POA: Insufficient documentation

## 2023-06-04 DIAGNOSIS — I10 Essential (primary) hypertension: Secondary | ICD-10-CM | POA: Insufficient documentation

## 2023-06-04 DIAGNOSIS — I7121 Aneurysm of the ascending aorta, without rupture: Secondary | ICD-10-CM | POA: Insufficient documentation

## 2023-06-04 DIAGNOSIS — I471 Supraventricular tachycardia, unspecified: Secondary | ICD-10-CM | POA: Diagnosis present

## 2023-06-04 DIAGNOSIS — E7800 Pure hypercholesterolemia, unspecified: Secondary | ICD-10-CM | POA: Diagnosis present

## 2023-06-04 NOTE — Assessment & Plan Note (Addendum)
 LDL optimal on labs 03/13/2023. - Continue Repatha  140 mg every 2 weeks - Continue Vascepa  2 g twice daily

## 2023-06-04 NOTE — Assessment & Plan Note (Addendum)
 Moderate nonobstructive disease by cardiac catheterization in 2022.  Nuclear stress test in 2023 negative for ischemia. Symptoms include episodes of chest discomfort, sometimes with exertion and sometimes at rest, occasionally resembling indigestion. Symptoms have been stable. Discussion on continuing current medical therapy and monitoring versus proceeding with exercise stress testing to assess for progression. He prefers to proceed with stress testing. Explained that if symptoms escalate, a heart catheterization may be necessary. - Order exercise stress test (myocardial perfusion imaging) - Continue aspirin  81 mg daily - Continue Repatha  140 mg every 2 weeks - Continue Isosorbide  mononitrate 45 mg daily - Continue Losartan  50 mg daily - Continue nitroglycerin  as needed

## 2023-06-04 NOTE — Patient Instructions (Signed)
 Medication Instructions:  Your physician recommends that you continue on your current medications as directed. Please refer to the Current Medication list given to you today.  *If you need a refill on your cardiac medications before your next appointment, please call your pharmacy*  Lab Work: None ordered If you have labs (blood work) drawn today and your tests are completely normal, you will receive your results only by: MyChart Message (if you have MyChart) OR A paper copy in the mail If you have any lab test that is abnormal or we need to change your treatment, we will call you to review the results.  Testing/Procedures: Your physician has requested that you have a lexiscan  myoview . For further information please visit [email or web address] Please follow instruction sheet, as given.   Follow-Up: At [facility], you and your health needs are our priority.  As part of our continuing mission to provide you with exceptional heart care, our providers are all part of one team.  This team includes your primary Cardiologist (physician) and Advanced Practice Providers or APPs (Physician Assistants and Nurse Practitioners) who all work together to provide you with the care you need, when you need it.  Your next appointment:   6 month(s)  Provider:   Dr Abel Hoe or Marlyse Single, PA-C

## 2023-06-04 NOTE — Assessment & Plan Note (Signed)
 Blood pressure is controlled with current medication regimen. - Continue Isosorbide  mononitrate 45 mg daily - Continue Losartan  50 mg daily

## 2023-06-04 NOTE — Progress Notes (Signed)
 OFFICE NOTE Date:  06/04/2023  ID:  Bradley Dean, DOB 03-Nov-1955, MRN [id number] PCP: Jimmey Mould, MD  [facility] Providers Cardiologist:  Ahmad Alert, MD     Patient Profile:     Coronary artery disease Cardiac CT 05/13/2020: CAC score 1646 (97th percentile) LHC 07/22/2020: LAD (heavily calcified) proximal 60, mid 60, D2 75; RCA proximal 40, distal 50> med Rx Myoview  12/11/2021: No ischemia or infarction, EF 47, low risk Thoracic aortic aneurysm Chest CTA 08/09/2021: Ascending thoracic aortic aneurysm 4 cm TTE 03/02/22: EF 50-55, no RWMA, normal RVSF, mild dilation of ascending aorta (40 mm), RAP 3 MRA 01/23/23: suboptimal evaluation - ascending aorta 3.9 cm fusiform >> CTA in 1 year Supraventricular Tachycardia  Monitor 11/29/2021: Occasional episodes of SVT (30 runs) PVCs  Hypertension  Hyperlipidemia  Intol of Atorvastatin   FHx of CAD  Carotid US  06/24/2020: No significant ICA stenosis bilaterally       Discussed the use of AI scribe software for clinical note transcription with the patient, who gave verbal consent to proceed. History of Present Illness Bradley Dean is a 68 y.o. male who returns for follow up of CAD, Supraventricular Tachycardia he was last seen by Charles Connor, NP in 02/2023.   He experiences episodes of chest discomfort, sometimes with exertion and sometimes at rest, which occasionally feels like indigestion. These symptoms are described as 'fairly stable' and are sometimes transient, resolving on his own. He experiences a 'weird feeling' after workouts and has had episodes of waking up at night feeling sweaty, which he associates with a possible blood pressure change. Nitroglycerin  has been used during these episodes, providing relief, although he is unsure if the relief is due to the medication or positional changes. No exertional chest pain, shortness of breath, passing out, or radiating pain to the arms or jaw. Occasionally, he experiences sharp pain  in the upper chest or back at the start of workouts, which resolves with continued exercise. He also reports occasional heartburn-like symptoms in the center of his chest and back, relieved by drinking water or taking antacids like Nexium or Pepto-Bismol. He engages in regular physical activity, including biking, walking, and light gym work, without exertional chest pain.    ROS-See HPI    Studies Reviewed:      Labs - Chart Review 03/13/23: K 4, SCr 0.97, ALT 15, TC 96, Tg 85, HDL 38, LDL 41 Results LABS Total cholesterol: 96 (03/13/2023) Triglycerides: 85 (03/13/2023) LDL: 41 (03/13/2023) HDL: 38 (03/13/2023)  Risk Assessment/Calculations:         Physical Exam:  VS:  BP (!) 112/54   Pulse 62   Ht 6' (1.829 m)   Wt 198 lb 6.4 oz (90 kg)   SpO2 96%   BMI 26.91 kg/m    Wt Readings from Last 3 Encounters:  06/04/23 198 lb 6.4 oz (90 kg)  02/22/23 201 lb 12.8 oz (91.5 kg)  01/14/23 194 lb 6.4 oz (88.2 kg)    Constitutional:      Appearance: Healthy appearance. Not in distress.  Neck:     Vascular: No carotid bruit. JVD normal.  Pulmonary:     Breath sounds: Normal breath sounds. No wheezing. No rales.  Cardiovascular:     Normal rate. Regular rhythm.     Murmurs: There is no murmur.  Edema:    Peripheral edema absent.  Abdominal:     Palpations: Abdomen is soft.      Assessment and Plan: Assessment & Plan  Coronary artery disease involving native coronary artery of native heart with angina pectoris (HCC) Moderate nonobstructive disease by cardiac catheterization in 2022.  Nuclear stress test in 2023 negative for ischemia. Symptoms include episodes of chest discomfort, sometimes with exertion and sometimes at rest, occasionally resembling indigestion. Symptoms have been stable. Discussion on continuing current medical therapy and monitoring versus proceeding with exercise stress testing to assess for progression. He prefers to proceed with stress testing. Explained that if  symptoms escalate, a heart catheterization may be necessary. - Order exercise stress test (myocardial perfusion imaging) - Continue aspirin  81 mg daily - Continue Repatha  140 mg every 2 weeks - Continue Isosorbide  mononitrate 45 mg daily - Continue Losartan  50 mg daily - Continue nitroglycerin  as needed SVT (supraventricular tachycardia) (HCC) Noted on prior monitor in 2023.  Symptoms are well controlled with current therapy. Propranolol  is used as needed. - Continue propranolol  10 mg as needed for palpitations Aneurysm of ascending aorta without rupture (HCC) 39 mm by MRA January 2025.  Follow-up chest CT pending for January 2026. Pure hypercholesterolemia LDL optimal on labs 03/13/2023. - Continue Repatha  140 mg every 2 weeks - Continue Vascepa  2 g twice daily Primary hypertension Blood pressure is controlled with current medication regimen. - Continue Isosorbide  mononitrate 45 mg daily - Continue Losartan  50 mg daily   Informed Consent   Shared Decision Making/Informed Consent The risks [chest pain, shortness of breath, cardiac arrhythmias, dizziness, blood pressure fluctuations, myocardial infarction, stroke/transient ischemic attack, nausea, vomiting, allergic reaction, radiation exposure, metallic taste sensation and life-threatening complications (estimated to be 1 in 10,000)], benefits (risk stratification, diagnosing coronary artery disease, treatment guidance) and alternatives of a nuclear stress test were discussed in detail with Bradley Dean and he agrees to proceed.     Dispo:  No follow-ups on file. Signed, Marlyse Single, PA-C

## 2023-06-11 ENCOUNTER — Other Ambulatory Visit: Payer: Self-pay | Admitting: Physician Assistant

## 2023-06-11 DIAGNOSIS — I25119 Atherosclerotic heart disease of native coronary artery with unspecified angina pectoris: Secondary | ICD-10-CM

## 2023-06-13 ENCOUNTER — Telehealth (HOSPITAL_COMMUNITY): Payer: Self-pay | Admitting: *Deleted

## 2023-06-13 NOTE — Telephone Encounter (Signed)
 Left message on voicemail per DPR in reference to upcoming appointment scheduled on 06/19/23 with detailed instructions given per Myocardial Perfusion Study Information Sheet for the test. LM to arrive 15 minutes early, and that it is imperative to arrive on time for appointment to keep from having the test rescheduled. If you need to cancel or reschedule your appointment, please call the office within 24 hours of your appointment. Failure to do so may result in a cancellation of your appointment, and a $50 no show fee. Phone number given for call back for any questions.  Argentina Bees, RN

## 2023-06-17 ENCOUNTER — Telehealth: Payer: Self-pay | Admitting: Pharmacist

## 2023-06-17 MED ORDER — REPATHA SURECLICK 140 MG/ML ~~LOC~~ SOAJ: 140.0000 mg | SUBCUTANEOUS | 3 refills | Status: AC

## 2023-06-17 NOTE — Telephone Encounter (Signed)
 Received Repatha  refill request from [employer], prescription sent

## 2023-06-18 ENCOUNTER — Encounter (HOSPITAL_COMMUNITY)

## 2023-06-19 ENCOUNTER — Ambulatory Visit (HOSPITAL_COMMUNITY)
Admission: RE | Admit: 2023-06-19 | Discharge: 2023-06-19 | Disposition: A | Discharge status: Home / Self Care | Source: Ambulatory Visit | Attending: Cardiology | Admitting: Cardiology

## 2023-06-19 DIAGNOSIS — I25119 Atherosclerotic heart disease of native coronary artery with unspecified angina pectoris: Secondary | ICD-10-CM | POA: Diagnosis present

## 2023-06-19 LAB — MYOCARDIAL PERFUSION IMAGING
Angina Index: 0
Estimated workload: 10.1
Exercise duration (min): 9 min
LV dias vol: 116 mL (ref 62–150)
LV sys vol: 50 mL (ref 4.2–5.8)
MPHR: 153 {beats}/min
Nuc Stress EF: 57 %
Peak HR: 179 {beats}/min
Percent HR: 116 %
Rest HR: 60 {beats}/min
Rest Nuclear Isotope Dose: 10.1 mCi
SDS: 2
SRS: 3
SSS: 3
Stress Nuclear Isotope Dose: 31.9 mCi
TID: 1.04

## 2023-06-19 MED ORDER — TECHNETIUM TC 99M TETROFOSMIN IV KIT
31.9000 | PACK | Freq: Once | INTRAVENOUS | Status: AC | PRN
  Administered 2023-06-19: 31.9 via INTRAVENOUS

## 2023-06-19 MED ORDER — TECHNETIUM TC 99M TETROFOSMIN IV KIT
10.1000 | PACK | Freq: Once | INTRAVENOUS | Status: AC | PRN
  Administered 2023-06-19: 10.1 via INTRAVENOUS

## 2023-06-20 ENCOUNTER — Ambulatory Visit: Payer: Self-pay | Admitting: Physician Assistant

## 2023-06-28 ENCOUNTER — Ambulatory Visit: Payer: Self-pay | Admitting: Physician Assistant

## 2023-07-26 ENCOUNTER — Telehealth: Payer: Self-pay

## 2023-07-26 NOTE — Telephone Encounter (Signed)
   Pre-operative Risk Assessment    Patient Name: Bradley Dean  DOB: 06-Sep-1955 MRN: [id number]   Date of last office visit: 06/04/2023, Glendia Ferrier, PA-C Date of next office visit: NONE   Request for Surgical Clearance    Procedure:  Colonoscopy  Date of Surgery:  Clearance 09/10/23                                Surgeon: Dr. Oliva Boots, MD Surgeon's Group or Practice Name: [facility]  Phone number: [phone] Fax number: [phone]   Type of Clearance Requested:   - Medical  - Pharmacy:  Hold Aspirin      Type of Anesthesia:  Propofol    Additional requests/questions:    Bonney Asberry KANDICE Ethelene   07/26/2023, 4:23 PM

## 2023-07-26 NOTE — Telephone Encounter (Signed)
   Patient Name: Bradley Dean  DOB: 03-12-1955 MRN: [id number]  Primary Cardiologist: Lonni Cash, MD  Chart reviewed as part of pre-operative protocol coverage. Given past medical history and time since last visit, based on ACC/AHA guidelines, Bradley Dean is at acceptable risk for the planned procedure without further cardiovascular testing. He had recent stress test on 06/28/2023 which was normal.   Per office protocol, if patient is without any new symptoms or concerns at the time of their virtual visit, he may hold ASA for 7 days prior to procedure. Please resume ASA as soon as possible postprocedure, at the discretion of the surgeon.    The patient was advised that if he develops new symptoms prior to surgery to contact our office to arrange for a follow-up visit, and he verbalized understanding.  I will route this recommendation to the requesting party via Epic fax function and remove from pre-op pool.  Please call with questions.  Lamarr Satterfield, NP 07/26/2023, 4:34 PM

## 2023-07-29 ENCOUNTER — Other Ambulatory Visit: Payer: Self-pay

## 2023-07-29 MED ORDER — ICOSAPENT ETHYL 1 G PO CAPS: 2.0000 g | ORAL_CAPSULE | Freq: Two times a day (BID) | ORAL | 3 refills | Status: AC

## 2023-07-29 NOTE — Telephone Encounter (Signed)
 RX sent to requested Pharmacy

## 2023-09-10 ENCOUNTER — Other Ambulatory Visit: Payer: Self-pay

## 2023-09-11 MED ORDER — LOSARTAN POTASSIUM 50 MG PO TABS: 50.0000 mg | ORAL_TABLET | Freq: Every day | ORAL | 3 refills | Status: AC

## 2023-10-11 ENCOUNTER — Other Ambulatory Visit (HOSPITAL_COMMUNITY): Payer: Self-pay | Admitting: Physician Assistant

## 2023-10-11 DIAGNOSIS — M533 Sacrococcygeal disorders, not elsewhere classified: Secondary | ICD-10-CM

## 2023-10-15 ENCOUNTER — Ambulatory Visit (HOSPITAL_COMMUNITY)
Admission: RE | Admit: 2023-10-15 | Discharge: 2023-10-15 | Disposition: A | Discharge status: Home / Self Care | Source: Ambulatory Visit | Attending: Physician Assistant | Admitting: Physician Assistant

## 2023-10-15 ENCOUNTER — Encounter (HOSPITAL_COMMUNITY): Payer: Self-pay

## 2023-10-15 DIAGNOSIS — M533 Sacrococcygeal disorders, not elsewhere classified: Secondary | ICD-10-CM | POA: Diagnosis present

## 2023-10-17 ENCOUNTER — Ambulatory Visit (HOSPITAL_COMMUNITY)

## 2023-10-18 ENCOUNTER — Other Ambulatory Visit: Payer: Self-pay | Admitting: Physician Assistant

## 2023-10-18 DIAGNOSIS — M545 Low back pain, unspecified: Secondary | ICD-10-CM

## 2023-10-22 ENCOUNTER — Ambulatory Visit (HOSPITAL_COMMUNITY)
Admission: RE | Admit: 2023-10-22 | Discharge: 2023-10-22 | Disposition: A | Discharge status: Home / Self Care | Source: Ambulatory Visit | Attending: Physician Assistant | Admitting: Physician Assistant

## 2023-10-22 DIAGNOSIS — M545 Low back pain, unspecified: Secondary | ICD-10-CM | POA: Insufficient documentation

## 2023-10-22 DIAGNOSIS — M4316 Spondylolisthesis, lumbar region: Secondary | ICD-10-CM

## 2023-10-22 DIAGNOSIS — M47816 Spondylosis without myelopathy or radiculopathy, lumbar region: Secondary | ICD-10-CM

## 2023-10-22 DIAGNOSIS — M51369 Other intervertebral disc degeneration, lumbar region without mention of lumbar back pain or lower extremity pain: Secondary | ICD-10-CM

## 2023-10-23 ENCOUNTER — Ambulatory Visit (HOSPITAL_COMMUNITY)

## 2023-11-15 ENCOUNTER — Other Ambulatory Visit: Payer: Self-pay | Admitting: Neurological Surgery

## 2023-11-15 DIAGNOSIS — M5416 Radiculopathy, lumbar region: Secondary | ICD-10-CM

## 2023-11-20 ENCOUNTER — Ambulatory Visit
Admission: RE | Admit: 2023-11-20 | Discharge: 2023-11-20 | Disposition: A | Discharge status: Home / Self Care | Source: Ambulatory Visit | Attending: Neurological Surgery | Admitting: Neurological Surgery

## 2023-11-20 DIAGNOSIS — M5416 Radiculopathy, lumbar region: Secondary | ICD-10-CM

## 2023-11-20 LAB — LAB REPORT - SCANNED: EGFR: 100

## 2023-11-27 ENCOUNTER — Telehealth: Payer: Self-pay

## 2023-11-27 NOTE — Telephone Encounter (Signed)
   Name: NAHIEM DREDGE  DOB: 1955-01-10  MRN: [id number]  Primary Cardiologist: Lonni Cash, MD  Chart reviewed as part of pre-operative protocol coverage. Because of Jonnie Truxillo Styer's past medical history and time since last visit, he will require a follow-up in-office visit in order to better assess preoperative cardiovascular risk.  Patient due for six month follow up ideally with Glendia Ferrier, PA.   Pre-op covering staff: - Please schedule appointment and call patient to inform them. If patient already had an upcoming appointment within acceptable timeframe, please add pre-op clearance to the appointment notes so provider is aware. - Please contact requesting surgeon's office via preferred method (i.e, phone, fax) to inform them of need for appointment prior to surgery.  Karlita Lichtman D Lorynn Moeser, NP  11/27/2023, 3:24 PM

## 2023-11-27 NOTE — Telephone Encounter (Signed)
   Pre-operative Risk Assessment    Patient Name: Bradley Dean  DOB: [birth year] MRN: [id number]   Date of last office visit: 06/03/2025[facility] Date of next office visit: None   Request for Surgical Clearance    Procedure:  L5-S1 Lumbar Laminectomy  Date of Surgery:  Clearance TBD                                Surgeon:  Alm CANDIE Molt Surgeon's Group or Practice Name:  [facility] Phone number:  [phone] Fax number:  [phone]   Type of Clearance Requested:   - Medical  - Pharmacy:  Hold Aspirin      Type of Anesthesia:  General    Additional requests/questions:    [name] [name]   11/27/2023, 3:09 PM

## 2023-11-27 NOTE — Telephone Encounter (Signed)
 S/W pt and scheduled IN OFFICE Preop appt for 12/16/23 with Olivia Pavy, PA-C  Will update the surgeons office.

## 2023-12-02 NOTE — Progress Notes (Unsigned)
 Cardiology Office Note:  .   Date:  12/16/2023  ID:  Bradley Dean, DOB 09/08/1955, MRN [id number] PCP: Okey Carlin Redbird, MD  [facility] Providers Cardiologist:  Lonni Cash, MD Cardiology APP:  Lelon Glendia DASEN, PA-C    History of Present Illness: .   Bradley Dean is a 68 y.o. male with history of CAD,ascending thoracic aortic aneurysm, SVT,PVC's, HTN, HLD,   Patient here for preop clearance. Denies chest pain, dyspnea, dizziness. Has an occasional skipped beat. A little ankle swelling since he can't sit or elevate his legs. He stands most of the day. Having a lot of back pain.   ROS:    Studies Reviewed: SABRA    EKG Interpretation Date/Time:  Monday December 16 2023 08:49:35 EST Ventricular Rate:  68 PR Interval:  134 QRS Duration:  98 QT Interval:  424 QTC Calculation: 450 R Axis:   -27  Text Interpretation: Normal sinus rhythm Normal ECG Confirmed by Parthenia Klinefelter [phone] on 12/16/2023 9:10:31 AM    Prior CV Studies:    Coronary artery disease NST 06/2023  Exercise stress test:  CLinically and electrically negative for ischemia.  Good exercise tolerance   LV perfusion is normal. There is no evidence of ischemia. There is no evidence of infarction.   Left ventricular function is normal. Nuclear stress EF: 57%   End diastolic cavity size is normal. End systolic cavity size is normal.   CT images were obtained for attenuation correction and were examined for the presence of coronary calcium  when appropriate.   Coronary calcium  was present on the attenuation correction CT images. Severe coronary calcifications were present. Coronary calcifications were present in the left anterior descending artery and right coronary artery distribution(s).   Overall low risk study   Cardiac CT 05/13/2020: CAC score 1646 (97th percentile) LHC 07/22/2020: LAD (heavily calcified) proximal 60, mid 60, D2 75; RCA proximal 40, distal 50> med Rx Myoview  12/11/2021: No ischemia or  infarction, EF 47, low risk   Exercise stress test:  CLinically and electrically negative for ischemia.  Good exercise tolerance   LV perfusion is normal. There is no evidence of ischemia. There is no evidence of infarction.   Left ventricular function is normal. Nuclear stress EF: 57%   End diastolic cavity size is normal. End systolic cavity size is normal.   CT images were obtained for attenuation correction and were examined for the presence of coronary calcium  when appropriate.   Coronary calcium  was present on the attenuation correction CT images. Severe coronary calcifications were present. Coronary calcifications were present in the left anterior descending artery and right coronary artery distribution(s).   Overall low risk study Thoracic aortic aneurysm Chest CTA 08/09/2021: Ascending thoracic aortic aneurysm 4 cm TTE 03/02/22: EF 50-55, no RWMA, normal RVSF, mild dilation of ascending aorta (40 mm), RAP 3 MRA 01/23/23: suboptimal evaluation - ascending aorta 3.9 cm fusiform >> CTA in 1 year Supraventricular Tachycardia  Monitor 11/29/2021: Occasional episodes of SVT (30 runs) PVCs Carotid US  06/24/2020: No significant ICA stenosis bilaterally       Risk Assessment/Calculations:             Physical Exam:   VS:  BP 124/82   Pulse 68   Ht 6' (1.829 m)   Wt 195 lb 6.4 oz (88.6 kg)   SpO2 97%   BMI 26.50 kg/m    Orhtostatics: No data found. Wt Readings from Last 3 Encounters:  12/16/23 195 lb 6.4 oz (88.6  kg)  06/04/23 198 lb 6.4 oz (90 kg)  02/22/23 201 lb 12.8 oz (91.5 kg)    GEN: Well nourished, well developed in no acute distress NECK: No JVD; No carotid bruits CARDIAC:  RRR, no murmurs, rubs, gallops RESPIRATORY:  Clear to auscultation without rales, wheezing or rhonchi  ABDOMEN: Soft, non-tender, non-distended EXTREMITIES:  No edema; No deformity   ASSESSMENT AND PLAN: .    Preop clearance for L5-S1 Lumbar laminectomy Dr. Alm Molt, [facility],  needs to hold ASA. Patient without cardiac complaints and BP well controlled. Low risk NST 06/2023. He's at reasonable risk to proceed with above surgery without further cardiac work up. He can hold ASA for 7 days prior to surgery. According to the Revised Cardiac Risk Index (RCRI), his Perioperative Risk of Major Cardiac Event is (%): 0.9  His Functional Capacity in METs is: 5.72 according to the [employer] Activity Status Index (DASI).   Coronary artery disease involving native coronary artery of native heart with angina pectoris  Moderate nonobstructive disease by cardiac catheterization in 2022.  Nuclear stress test in 2023 & 06/2023 negative for ischemia.- Continue aspirin  81 mg daily - Continue Repatha  140 mg every 2 weeks - Continue Isosorbide  mononitrate 45 mg daily - Continue Losartan  50 mg daily - Continue nitroglycerin  as needed  SVT (supraventricular tachycardia)  Noted on prior monitor in 2023.  Symptoms are well controlled with current therapy. Propranolol  is used as needed. - Continue propranolol  10 mg as needed for palpitations  Aneurysm of ascending aorta without rupture  39 mm by MRA January 2025.  Follow-up chest CT pending for January 2026 but patient would like to avoid radiation since he's had a lot this year. Will order echo and hold off on CTA until next year.  Pure hypercholesterolemia LDL 57 11/2023 - Continue Repatha  140 mg every 2 weeks - Continue Vascepa  2 g twice daily  Primary hypertension Blood pressure is well controlled   - Continue Isosorbide  mononitrate 45 mg daily - Continue Losartan  50 mg daily  Leg swelling minimal likely secondary to standing all day and not able to sit and elevate legs. Compression stockings and echo ordered.       Dispo: f/u Dr. Verlin 6 months  Signed, Olivia Pavy, PA-C

## 2023-12-06 LAB — LAB REPORT - SCANNED
A1c: 5.7
EGFR: 97

## 2023-12-16 ENCOUNTER — Encounter: Payer: Self-pay | Admitting: Physician Assistant

## 2023-12-16 ENCOUNTER — Ambulatory Visit: Admitting: Physician Assistant

## 2023-12-16 VITALS — BP 124/82 | HR 68 | Ht 72.0 in | Wt 195.4 lb

## 2023-12-16 DIAGNOSIS — Z01818 Encounter for other preprocedural examination: Secondary | ICD-10-CM | POA: Diagnosis not present

## 2023-12-16 DIAGNOSIS — I7121 Aneurysm of the ascending aorta, without rupture: Secondary | ICD-10-CM | POA: Diagnosis present

## 2023-12-16 DIAGNOSIS — I471 Supraventricular tachycardia, unspecified: Secondary | ICD-10-CM | POA: Diagnosis not present

## 2023-12-16 DIAGNOSIS — I25119 Atherosclerotic heart disease of native coronary artery with unspecified angina pectoris: Secondary | ICD-10-CM | POA: Diagnosis not present

## 2023-12-16 DIAGNOSIS — E7800 Pure hypercholesterolemia, unspecified: Secondary | ICD-10-CM | POA: Diagnosis present

## 2023-12-16 DIAGNOSIS — I10 Essential (primary) hypertension: Secondary | ICD-10-CM

## 2023-12-16 NOTE — Patient Instructions (Signed)
 Medication Instructions:  Your physician recommends that you continue on your current medications as directed. Please refer to the Current Medication list given to you today.  *If you need a refill on your cardiac medications before your next appointment, please call your pharmacy*  Lab Work: None  If you have labs (blood work) drawn today and your tests are completely normal, you will receive your results only by: MyChart Message (if you have MyChart) OR A paper copy in the mail If you have any lab test that is abnormal or we need to change your treatment, we will call you to review the results.  Testing/Procedures: Echocardiogram @ [facility] Your physician has requested that you have an echocardiogram. Echocardiography is a painless test that uses sound waves to create images of your heart. It provides your doctor with information about the size and shape of your heart and how well your hearts chambers and valves are working. This procedure takes approximately one hour. There are no restrictions for this procedure. Please do NOT wear cologne, perfume, aftershave, or lotions (deodorant is allowed). Please arrive 15 minutes prior to your appointment time.  Please note: We ask at that you not bring children with you during ultrasound (echo/ vascular) testing. Due to room size and safety concerns, children are not allowed in the ultrasound rooms during exams. Our front office staff cannot provide observation of children in our lobby area while testing is being conducted. An adult accompanying a patient to their appointment will only be allowed in the ultrasound room at the discretion of the ultrasound technician under special circumstances. We apologize for any inconvenience.    Follow-Up: At [facility], you and your health needs are our priority.  As part of our continuing mission to provide you with exceptional heart care, our providers are all part of one team.  This team includes  your primary Cardiologist (physician) and Advanced Practice Providers or APPs (Physician Assistants and Nurse Practitioners) who all work together to provide you with the care you need, when you need it.  Your next appointment:   6 month(s)  Provider:   Lonni Cash, MD    We recommend signing up for the patient portal called MyChart.  Sign up information is provided on this After Visit Summary.  MyChart is used to connect with patients for Virtual Visits (Telemedicine).  Patients are able to view lab/test results, encounter notes, upcoming appointments, etc.  Non-urgent messages can be sent to your provider as well.   To learn more about what you can do with MyChart, go to [email or web address].   Other Instructions None

## 2024-01-12 ENCOUNTER — Other Ambulatory Visit: Payer: Self-pay

## 2024-01-12 ENCOUNTER — Emergency Department (HOSPITAL_BASED_OUTPATIENT_CLINIC_OR_DEPARTMENT_OTHER)

## 2024-01-12 ENCOUNTER — Encounter (HOSPITAL_BASED_OUTPATIENT_CLINIC_OR_DEPARTMENT_OTHER): Payer: Self-pay

## 2024-01-12 ENCOUNTER — Emergency Department (HOSPITAL_BASED_OUTPATIENT_CLINIC_OR_DEPARTMENT_OTHER)
Admission: EM | Admit: 2024-01-12 | Discharge: 2024-01-12 | Disposition: A | Discharge status: Home / Self Care | Attending: Emergency Medicine | Admitting: Emergency Medicine

## 2024-01-12 DIAGNOSIS — I2510 Atherosclerotic heart disease of native coronary artery without angina pectoris: Secondary | ICD-10-CM | POA: Insufficient documentation

## 2024-01-12 DIAGNOSIS — Z79899 Other long term (current) drug therapy: Secondary | ICD-10-CM | POA: Insufficient documentation

## 2024-01-12 DIAGNOSIS — I10 Essential (primary) hypertension: Secondary | ICD-10-CM | POA: Diagnosis not present

## 2024-01-12 DIAGNOSIS — Z7982 Long term (current) use of aspirin: Secondary | ICD-10-CM | POA: Diagnosis not present

## 2024-01-12 DIAGNOSIS — M79605 Pain in left leg: Secondary | ICD-10-CM | POA: Diagnosis present

## 2024-01-12 NOTE — ED Provider Notes (Signed)
 " [place] [facility] AT [facility] Provider Note   CSN: [id number] Arrival date & time: 01/12/24  1437     Patient presents with: Leg Pain   Bradley Dean is a 69 y.o. 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  Endorses some right lateral calf pain starting last night.  Surgery center advised to come to ED for DVT rule out.  Also considered whether this is just some nerve root inflammation status post procedure.  No history of blood clots.  No shortness of breath.    Leg Pain      Prior to Admission medications  Medication Sig Start Date End Date Taking? Authorizing Provider  allopurinol (ZYLOPRIM) 100 MG tablet Take 200 mg by mouth daily. 08/15/21   [provider]  aspirin  EC 81 MG tablet Take 81 mg by mouth at bedtime. Swallow whole.    [provider]  Cholecalciferol (VITAMIN D3) 125 MCG (5000 UT) CAPS Per patient taking 2,000 units by mouth every morning    [provider]  Coenzyme Q10 (COQ10) 200 MG CAPS Take 200 mg by mouth at bedtime.    [provider]  colchicine 0.6 MG tablet Take 0.6 mg by mouth as needed (for gout flare up).    [provider]  Evolocumab  (REPATHA  SURECLICK) 140 MG/ML SOAJ Inject 140 mg into the skin every 14 (fourteen) days. 06/17/23   Camnitz, Soyla Lunger, MD  gabapentin (NEURONTIN) 300 MG capsule Take 300 mg by mouth 3 (three) times daily.    [provider]  icosapent  Ethyl (VASCEPA ) 1 g capsule Take 2 capsules (2 g total) by mouth 2 (two) times daily. 07/29/23   Lelon Hamilton T, PA-C  isosorbide  mononitrate (IMDUR ) 30 MG 24 hr tablet TAKE 1 AND 1/2 TABLET BY MOUTH DAILY 05/08/23   Dick, Ernest H Jr., NP  loratadine (CLARITIN) 10 MG tablet Take 10 mg by mouth daily as needed for allergies.    [provider]  losartan  (COZAAR ) 50 MG tablet Take 1 tablet (50 mg  total) by mouth daily. 09/11/23   Weaver, Scott T, PA-C  Menaquinone-7 (VITAMIN K2) 100 MCG CAPS Take 200 mcg by mouth in the morning.    [provider]  metFORMIN (GLUCOPHAGE-XR) 500 MG 24 hr tablet Take 1,000 mg by mouth daily. 02/19/21   [provider]  nitroGLYCERIN  (NITROSTAT ) 0.4 MG SL tablet DISSOLVE 1 TABLET UNDER THE TONGUE EVERY 5 MINUTES AS  NEEDED FOR CHEST PAIN. MAX  OF 3 TABLETS IN 15 MINUTES. CALL 911 IF PAIN PERSISTS. 02/01/22   Nahser, Aleene PARAS, MD  propranolol  (INDERAL ) 10 MG tablet TAKE 1 TABLET BY MOUTH 4 TIMES  DAILY AS NEEDED FOR PREMATURE  VENTRICULAR CONTRACTIONS 02/01/22   Nahser, Aleene PARAS, MD  rosuvastatin  (CRESTOR ) 10 MG tablet TAKE 1 TABLET BY MOUTH DAILY 05/28/23   Nahser, Aleene PARAS, MD    Allergies: Fluticasone, Sulfa antibiotics, Sulfa drugs cross reactors, and Lipitor [atorvastatin ]    Review of Systems  All other systems reviewed and are negative.   Updated Vital Signs BP (!) 149/85   Pulse 68   Temp 98.2 F (36.8 C) (Oral)   Resp 17   Ht 6' (1.829 m)   Wt 87.1 kg   SpO2 98%   BMI 26.04 kg/m   Physical Exam Vitals and nursing note reviewed.  Constitutional:      General: He is  not in acute distress.    Appearance: Normal appearance.  HENT:     Head: Normocephalic and atraumatic.  Eyes:     General:        Right eye: No discharge.        Left eye: No discharge.  Cardiovascular:     Rate and Rhythm: Normal rate and regular rhythm.     Heart sounds: No murmur heard.    No friction rub. No gallop.  Pulmonary:     Effort: Pulmonary effort is normal.     Breath sounds: Normal breath sounds.  Abdominal:     General: Bowel sounds are normal.     Palpations: Abdomen is soft.  Musculoskeletal:     Comments: No significant calf swelling, tenderness, redness  Skin:    General: Skin is warm and dry.     Capillary Refill: Capillary refill takes less than 2 seconds.  Neurological:     Mental Status: He is alert and oriented to person,  place, and time.  Psychiatric:        Mood and Affect: Mood normal.        Behavior: Behavior normal.     (all labs ordered are listed, but only abnormal results are displayed) Labs Reviewed - No data to display  EKG: None  Radiology: US  Venous Img Lower Right (DVT Study) Result Date: 01/12/2024 CLINICAL DATA:  Right leg swelling, pain EXAM: RIGHT LOWER EXTREMITY VENOUS DOPPLER ULTRASOUND TECHNIQUE: Gray-scale sonography with compression, as well as color and duplex ultrasound, were performed to evaluate the deep venous system(s) from the level of the common femoral vein through the popliteal and proximal calf veins. COMPARISON:  None Available. FINDINGS: VENOUS Normal compressibility of the common femoral, superficial femoral, and popliteal veins, as well as the visualized calf veins. Visualized portions of profunda femoral vein and great saphenous vein unremarkable. No filling defects to suggest DVT on grayscale or color Doppler imaging. Doppler waveforms show normal direction of venous flow, normal respiratory plasticity and response to augmentation. Limited views of the contralateral common femoral vein are unremarkable. OTHER None. Limitations: none IMPRESSION: 1. No evidence of deep venous thrombosis within the right lower extremity. Electronically Signed   By: Ozell Daring M.D.   On: 01/12/2024 16:44     Procedures   Medications Ordered in the ED - No data to display                                  Medical Decision Making  This patient is a 69 y.o. male  who presents to the ED for concern of calf pain.   Differential diagnoses prior to evaluation: The emergent differential diagnosis includes, but is not limited to,  DVT, peripheral edema, Achilles tendon rupture, calf muscle strain, calf muscle rupture, cellulitis, abscess, PAD, PVD, versus other musculoskeletal injury --overall higher clinical suspicion for related to his recent laminectomy, some inflammation postsurgically  causing nerve irritation. This is not an exhaustive differential.   Past Medical History / Co-morbidities / Social History: Htn, hld, cad, recent lumbar laminectomy  Physical Exam: Physical exam performed. The pertinent findings include: No significant calf swelling, tenderness, redness   DP, PT pulses are 2+ in the affected extremity  Lab Tests/Imaging studies: I personally interpreted labs/imaging and the pertinent results include: Ultrasound of the right lower extremity.  Shows I agree with the radiologist interpretation.   Medications: Patient already prescribed steroid burst by  surgeon, encouraged him to go ahead and take it. Otherwise, encourage ibuprofen, tylenol , rest.   Disposition: After consideration of the diagnostic results and the patients response to treatment, I feel that patient is stable for discharge, no evidence of lower extremity clot .   emergency department workup does not suggest an emergent condition requiring admission or immediate intervention beyond what has been performed at this time. The plan is: as above. The patient is safe for discharge and has been instructed to return immediately for worsening symptoms, change in symptoms or any other concerns.   Final diagnoses:  Left leg pain    ED Discharge Orders     None          Rosan Sherlean DEL, [place] 01/12/24 1651  "

## 2024-01-12 NOTE — ED Triage Notes (Signed)
 Presents to ED with c/o R lateral calf pain that started last night. Recent laminectomy 1/7. Called on-call sx center and was advised to come to ED for DVT r/o. CMS intact. Pt ambulatory

## 2024-01-12 NOTE — Discharge Instructions (Signed)
 Please use Tylenol  or ibuprofen for pain.  You may use 600 mg ibuprofen every 6 hours or 1000 mg of Tylenol  every 6 hours.  You may choose to alternate between the 2.  This would be most effective.  Not to exceed 4 g of Tylenol  within 24 hours.  Not to exceed 3200 mg ibuprofen 24 hours.  Take the steroid pack prescribed by your surgeon.

## 2024-01-24 ENCOUNTER — Other Ambulatory Visit (HOSPITAL_BASED_OUTPATIENT_CLINIC_OR_DEPARTMENT_OTHER)

## 2024-02-04 ENCOUNTER — Other Ambulatory Visit: Payer: Self-pay | Admitting: Physician Assistant

## 2024-02-04 DIAGNOSIS — I77810 Thoracic aortic ectasia: Secondary | ICD-10-CM

## 2024-02-21 ENCOUNTER — Other Ambulatory Visit (HOSPITAL_BASED_OUTPATIENT_CLINIC_OR_DEPARTMENT_OTHER)

## 2024-06-05 ENCOUNTER — Ambulatory Visit: Admitting: Cardiovascular Disease
